# Patient Record
Sex: Female | Born: 1970 | Race: White | Hispanic: No | Marital: Married | Smoking: Never smoker
Health system: Southern US, Community
[De-identification: ages and names within clinical notes are randomized; demographics above are authoritative.]

## PROBLEM LIST (undated history)

## (undated) DIAGNOSIS — E1165 Type 2 diabetes mellitus with hyperglycemia: Secondary | ICD-10-CM

## (undated) DIAGNOSIS — K219 Gastro-esophageal reflux disease without esophagitis: Secondary | ICD-10-CM

## (undated) DIAGNOSIS — E119 Type 2 diabetes mellitus without complications: Secondary | ICD-10-CM

## (undated) DIAGNOSIS — R7303 Prediabetes: Secondary | ICD-10-CM

## (undated) DIAGNOSIS — F419 Anxiety disorder, unspecified: Secondary | ICD-10-CM

## (undated) DIAGNOSIS — Z87442 Personal history of urinary calculi: Secondary | ICD-10-CM

## (undated) DIAGNOSIS — L508 Other urticaria: Secondary | ICD-10-CM

## (undated) HISTORY — PX: TUBAL LIGATION: SHX77

---

## 1997-09-05 HISTORY — PX: TUBAL LIGATION: SHX77

## 2006-12-23 ENCOUNTER — Emergency Department: Payer: Self-pay | Admitting: Emergency Medicine

## 2006-12-24 ENCOUNTER — Emergency Department: Payer: Self-pay | Admitting: Emergency Medicine

## 2009-09-05 HISTORY — PX: LITHOTRIPSY: SUR834

## 2015-03-22 ENCOUNTER — Encounter: Payer: Self-pay | Admitting: Emergency Medicine

## 2015-03-22 DIAGNOSIS — Y9389 Activity, other specified: Secondary | ICD-10-CM | POA: Diagnosis not present

## 2015-03-22 DIAGNOSIS — Y9289 Other specified places as the place of occurrence of the external cause: Secondary | ICD-10-CM | POA: Insufficient documentation

## 2015-03-22 DIAGNOSIS — S0591XA Unspecified injury of right eye and orbit, initial encounter: Secondary | ICD-10-CM | POA: Diagnosis present

## 2015-03-22 DIAGNOSIS — Y998 Other external cause status: Secondary | ICD-10-CM | POA: Insufficient documentation

## 2015-03-22 DIAGNOSIS — W228XXA Striking against or struck by other objects, initial encounter: Secondary | ICD-10-CM | POA: Diagnosis not present

## 2015-03-22 DIAGNOSIS — S0501XA Injury of conjunctiva and corneal abrasion without foreign body, right eye, initial encounter: Secondary | ICD-10-CM | POA: Insufficient documentation

## 2015-03-22 NOTE — ED Notes (Signed)
Pt says she bent over a plant and a stem scratched her right eye; eye watering; unable to open eye due to pain;

## 2015-03-23 ENCOUNTER — Emergency Department
Admission: EM | Admit: 2015-03-23 | Discharge: 2015-03-23 | Disposition: A | Discharge status: Home / Self Care | Payer: 59 | Attending: Student | Admitting: Student

## 2015-03-23 DIAGNOSIS — S0501XA Injury of conjunctiva and corneal abrasion without foreign body, right eye, initial encounter: Secondary | ICD-10-CM

## 2015-03-23 MED ORDER — TETRACAINE HCL 0.5 % OP SOLN
OPHTHALMIC | Status: AC
  Filled 2015-03-23: qty 2

## 2015-03-23 MED ORDER — ERYTHROMYCIN 5 MG/GM OP OINT
1.0000 | TOPICAL_OINTMENT | Freq: Four times a day (QID) | OPHTHALMIC | Status: AC
Start: 2015-03-23 — End: 2015-03-29

## 2015-03-23 MED ORDER — IBUPROFEN 600 MG PO TABS: 600.0000 mg | ORAL_TABLET | Freq: Four times a day (QID) | ORAL | Status: DC | PRN

## 2015-03-23 MED ORDER — ERYTHROMYCIN 5 MG/GM OP OINT
TOPICAL_OINTMENT | Freq: Once | OPHTHALMIC | Status: AC
  Administered 2015-03-23: 1 via OPHTHALMIC
  Filled 2015-03-23: qty 1

## 2015-03-23 MED ORDER — FLUORESCEIN SODIUM 1 MG OP STRP
ORAL_STRIP | OPHTHALMIC | Status: AC
  Filled 2015-03-23: qty 1

## 2015-03-23 NOTE — ED Notes (Signed)
Pt alert and in NAD at time of d/c 

## 2015-03-23 NOTE — ED Notes (Signed)
MD at bedside. 

## 2015-03-23 NOTE — ED Provider Notes (Signed)
[facility] Emergency Department Provider Note  ____________________________________________  Time seen: Approximately 1:13 AM  I have reviewed the triage vital signs and the nursing notes.   HISTORY  Chief Complaint Eye Pain    HPI Heidi Allen is a 44 y.o. female with no chronic medical problems who presents for evaluation of traumatic right eye pain which began suddenly just prior to arrival and has been constant. Patient was bending over a plant when the stem scratched her right eye. She denies any vision changes. Her eye has been watering since this time. She does not wear contact lenses. Current severity of symptoms is 10 out of 10. No modifying factors. She has otherwise been in her usual state of health.   History reviewed. No pertinent past medical history.  There are no active problems to display for this patient.   Past Surgical History  Procedure Laterality Date  . Tubal ligation      Current Outpatient Rx  Name  Route  Sig  Dispense  Refill  . erythromycin ophthalmic ointment   Right Eye   Place 1 application into the right eye 4 (four) times daily. Dispense QS for 7 days.   3.5 g   0   . ibuprofen (ADVIL,MOTRIN) 600 MG tablet   Oral   Take 1 tablet (600 mg total) by mouth every 6 (six) hours as needed for moderate pain.   15 tablet   0     Allergies Review of patient's allergies indicates no known allergies.  History reviewed. No pertinent family history.  Social History History  Substance Use Topics  . Smoking status: Never Smoker   . Smokeless tobacco: Never Used  . Alcohol Use: No    Review of Systems Constitutional: No fever/chills Eyes: No visual changes. ENT: No sore throat. Cardiovascular: Denies chest pain. Respiratory: Denies shortness of breath. Gastrointestinal: No abdominal pain.  No nausea, no vomiting.  No diarrhea.  No constipation. Genitourinary: Negative for dysuria. Musculoskeletal: Negative for  back pain. Skin: Negative for rash. Neurological: Negative for headaches, focal weakness or numbness.  10-point ROS otherwise negative.  ____________________________________________   PHYSICAL EXAM:  VITAL SIGNS: ED Triage Vitals  Enc Vitals Group     BP 03/22/15 2333 146/78 mmHg     Pulse Rate 03/22/15 2333 91     Resp 03/22/15 2333 18     Temp 03/22/15 2333 98.1 F (36.7 C)     Temp Source 03/22/15 2333 Oral     SpO2 03/22/15 2333 98 %     Weight 03/22/15 2333 208 lb (94.348 kg)     Height 03/22/15 2333 5\' 7"  (1.702 m)     Head Cir --      Peak Flow --      Pain Score 03/22/15 2334 0     Pain Loc --      Pain Edu? --      Excl. in [place]? --     Constitutional: Alert and oriented. Well appearing and in no acute distress. Eyes: Pupils equally round and reactive to light, extra ocular movements intact. Right eye exam and with Wood's lamp under fluoroscopy seen and 2 distinct corneal abrasions are noted at the 9:00 and 11:00 position. Head: Atraumatic. Nose: No congestion/rhinnorhea. Mouth/Throat: Mucous membranes are moist.  Oropharynx non-erythematous. Neck: No stridor.   Cardiovascular: Normal rate, regular rhythm. Grossly normal heart sounds.  Good peripheral circulation. Respiratory: Normal respiratory effort.  No retractions. Lungs CTAB. Gastrointestinal: Soft and nontender. No  distention. No abdominal bruits. No CVA tenderness. Genitourinary: deferred Musculoskeletal: No lower extremity tenderness nor edema.  No joint effusions. Neurologic:  Normal speech and language. No gross focal neurologic deficits are appreciated. No gait instability. Skin:  Skin is warm, dry and intact. No rash noted. Psychiatric: Mood and affect are normal. Speech and behavior are normal.  ____________________________________________   LABS (all labs ordered are listed, but only abnormal results are displayed)  Labs Reviewed - No data to  display ____________________________________________  EKG  none ____________________________________________  RADIOLOGY none ____________________________________________   PROCEDURES  Procedure(s) performed: None  Critical Care performed: No  ____________________________________________   INITIAL IMPRESSION / ASSESSMENT AND PLAN / ED COURSE  Pertinent labs & imaging results that were available during my care of the patient were reviewed by me and considered in my medical decision making (see chart for details).  Heidi Allen is a 44 y.o. female with no chronic medical problems who presents for evaluation of traumatic right eye pain which began suddenly just prior to arrival and has been constant. On exam, she is unable to open the right eye secondary to pain. She is. Immediate relief after tetracaine eyedrops and was able to open the eye, pupils equally reactive to light, extraocular movements intact. Exam is consistent with corneal abrasions of the right eye. She does not wear contact lenses. She has no vision compromise (VA 20/25 in right eye, 20/30 left eye). We'll discharge with erythromycin drops and she will follow-up with her eye doctor within the next 2-3 days. Return precautions discussed. DC home. She reports she does not want any narcotic pain medications so we'll discharge with ibuprofen. ____________________________________________   FINAL CLINICAL IMPRESSION(S) / ED DIAGNOSES  Final diagnoses:  Corneal abrasion, right, initial encounter      Heidi Gavel, MD 03/23/15 0236

## 2015-03-23 NOTE — ED Notes (Addendum)
Pt stated she did not want to wait anymore and was leaving. RN encouraged pt to stay. Pt walked back into room.

## 2017-12-08 ENCOUNTER — Encounter: Payer: Self-pay | Admitting: Emergency Medicine

## 2017-12-08 ENCOUNTER — Other Ambulatory Visit: Payer: Self-pay

## 2017-12-08 ENCOUNTER — Ambulatory Visit
Admission: EM | Admit: 2017-12-08 | Discharge: 2017-12-08 | Disposition: A | Discharge status: Home / Self Care | Payer: BLUE CROSS/BLUE SHIELD | Attending: Family Medicine | Admitting: Family Medicine

## 2017-12-08 DIAGNOSIS — J0110 Acute frontal sinusitis, unspecified: Secondary | ICD-10-CM | POA: Diagnosis not present

## 2017-12-08 MED ORDER — HYDROCOD POLST-CPM POLST ER 10-8 MG/5ML PO SUER: 5.0000 mL | Freq: Every evening | ORAL | 0 refills | Status: DC | PRN

## 2017-12-08 MED ORDER — AMOXICILLIN-POT CLAVULANATE 875-125 MG PO TABS: 1.0000 | ORAL_TABLET | Freq: Two times a day (BID) | ORAL | 0 refills | Status: DC

## 2017-12-08 NOTE — ED Provider Notes (Signed)
[facility]  CSN: [id number] Arrival date & time: 12/08/17  5284   History   Chief Complaint Chief Complaint  Patient presents with  . Cough  . Sinus Problem  . Nasal Congestion   HPI  47 year old female presents with the above complaints.  Patient reports a 2-week history of sinus pressure and pain.  Associated cough.  No fever.  She reports discolored nasal discharge.  She reports productive cough.  She been taking Mucinex without improvement.  No known exacerbating factors.  No other associated symptoms.  No other complaints or concerns at this time.  PMH: Urinary tract infection    Anemia  Tx w/ Fe supplementation  Thyroid disease    Thyroid disease    Cholelithiasis 05/26/2012   GERD (gastroesophageal reflux disease) 07/08/2011   Alopecia areata 04/08/2013   Anxiety 05/18/2016 Overview: metoprolol  Hyperglycemia 06/07/2014 Overview: a1c 6.0 on 06/2014  Hypertriglyceridemia 06/07/2014   Iron deficiency anemia due to chronic blood loss 06/07/2014   Obesity 04/15/2013   Seasonal allergies    Diabetes mellitus (CMS-HCC)    Folliculitis     Past Surgical History:  Procedure Laterality Date  . TUBAL LIGATION     OB History   None      Home Medications    Prior to Admission medications   Medication Sig Start Date End Date Taking? Authorizing Provider  ferrous sulfate 325 (65 FE) MG tablet Take by mouth.   Yes [provider]  metFORMIN (GLUCOPHAGE) 500 MG tablet Take by mouth 2 (two) times daily with a meal.   Yes [provider]  metFORMIN (GLUCOPHAGE-XR) 500 MG 24 hr tablet  12/06/17  Yes [provider]  amoxicillin-clavulanate (AUGMENTIN) 875-125 MG tablet Take 1 tablet by mouth every 12 (twelve) hours. 12/08/17   Coral Spikes, DO  chlorpheniramine-HYDROcodone (TUSSIONEX PENNKINETIC ER) 10-8 MG/5ML SUER Take 5 mLs by mouth at bedtime as needed. 12/08/17   Coral Spikes, DO  ibuprofen (ADVIL,MOTRIN) 600 MG tablet Take 1  tablet (600 mg total) by mouth every 6 (six) hours as needed for moderate pain. 03/23/15   Joanne Gavel, MD  Multiple Vitamin (MULTI-VITAMINS) TABS Take by mouth.    [provider]   Family History Family History  Problem Relation Age of Onset  . Diabetes Mother   . Diabetes Father    Social History Social History   Tobacco Use  . Smoking status: Never Smoker  . Smokeless tobacco: Never Used  Substance Use Topics  . Alcohol use: No  . Drug use: No     Allergies   Patient has no known allergies.   Review of Systems Review of Systems  Constitutional: Negative for fever.  HENT: Positive for sinus pressure and sinus pain.   Respiratory: Positive for cough.    Physical Exam Triage Vital Signs ED Triage Vitals  Enc Vitals Group     BP 12/08/17 0832 124/69     Pulse Rate 12/08/17 0832 (!) 102     Resp 12/08/17 0832 14     Temp 12/08/17 0832 98.9 F (37.2 C)     Temp Source 12/08/17 0832 Oral     SpO2 12/08/17 0832 99 %     Weight 12/08/17 0828 195 lb (88.5 kg)     Height 12/08/17 0828 5\' 7"  (1.702 m)     Head Circumference --      Peak Flow --      Pain Score 12/08/17 0828 2  Pain Loc --      Pain Edu? --      Excl. in [place]? --    Updated Vital Signs BP 124/69 (BP Location: Left Arm)   Pulse (!) 102   Temp 98.9 F (37.2 C) (Oral)   Resp 14   Ht 5\' 7"  (1.702 m)   Wt 195 lb (88.5 kg)   LMP 12/04/2017 (Exact Date)   SpO2 99%   BMI 30.54 kg/m   Physical Exam  Constitutional: She is oriented to person, place, and time. She appears well-developed. No distress.  HENT:  Head: Normocephalic and atraumatic.  Mouth/Throat: Oropharynx is clear and moist.  Frontal sinus tenderness to palpation.  Eyes: Conjunctivae are normal. Right eye exhibits no discharge. Left eye exhibits no discharge.  Cardiovascular: Normal rate and regular rhythm.  Pulmonary/Chest: Effort normal and breath sounds normal. She has no wheezes. She has no rales.  Neurological: She  is alert and oriented to person, place, and time.  Psychiatric: She has a normal mood and affect. Her behavior is normal.  Nursing note and vitals reviewed.  UC Treatments / Results  Labs (all labs ordered are listed, but only abnormal results are displayed) Labs Reviewed - No data to display  EKG None Radiology No results found.  Procedures Procedures (including critical care time)  Medications Ordered in UC Medications - No data to display   Initial Impression / Assessment and Plan / UC Course  I have reviewed the triage vital signs and the nursing notes.  Pertinent labs & imaging results that were available during my care of the patient were reviewed by me and considered in my medical decision making (see chart for details).     47 year old female presents with sinusitis.  Treating with Augmentin.  Tussionex for cough.  Final Clinical Impressions(s) / UC Diagnoses   Final diagnoses:  Acute frontal sinusitis, recurrence not specified    ED Discharge Orders        Ordered    amoxicillin-clavulanate (AUGMENTIN) 875-125 MG tablet  Every 12 hours     12/08/17 0839    chlorpheniramine-HYDROcodone (TUSSIONEX PENNKINETIC ER) 10-8 MG/5ML SUER  At bedtime PRN     12/08/17 0839     Controlled Substance Prescriptions [place] Controlled Substance Registry consulted? Not Applicable   Coral Spikes, [place] 12/08/17 [phone]

## 2017-12-08 NOTE — ED Triage Notes (Signed)
Patient c/o sinus congestion and pressure, cough, and scratchy throat for the past 2 weeks.  Patient denies fevers.

## 2020-05-25 ENCOUNTER — Encounter: Payer: Self-pay | Admitting: Obstetrics and Gynecology

## 2020-05-25 ENCOUNTER — Ambulatory Visit (INDEPENDENT_AMBULATORY_CARE_PROVIDER_SITE_OTHER): Payer: 59 | Admitting: Obstetrics and Gynecology

## 2020-05-25 ENCOUNTER — Other Ambulatory Visit: Payer: Self-pay

## 2020-05-25 VITALS — BP 146/90 | HR 124 | Ht 67.0 in | Wt 202.0 lb

## 2020-05-25 DIAGNOSIS — N939 Abnormal uterine and vaginal bleeding, unspecified: Secondary | ICD-10-CM | POA: Diagnosis not present

## 2020-05-25 MED ORDER — MEDROXYPROGESTERONE ACETATE 10 MG PO TABS: 20.0000 mg | ORAL_TABLET | Freq: Every day | ORAL | 2 refills | Status: DC

## 2020-05-25 NOTE — Progress Notes (Signed)
Gynecology Abnormal Uterine Bleeding Initial Evaluation   Chief Complaint:  Chief Complaint  Patient presents with  . Menorrhagia    History of Present Illness:    Paitient is a 49 y.o. G3P3 who LMP was Patient's last menstrual period was 05/19/2020., presents today for a problem visit.  She complains of menorrhagia that  began several years ago and its severity is described as moderate.  The patient menstrual complaints are chronic present for the past 6 months.  Menstrual cycles are associated with moderate menstrual cramping.  She does report passage of clots. The patient is sexually active. She currently uses tubal ligationfor contraception.  Last Pap results tesults were obtained 06/13/2019 NIL and HR HPV negative   Previous evaluation: CT abdomen and pelvis 2008 normal reproductive structures. TVUS 04/12/2018 with uterine enlargement noted Posterior uterine fundus intramural fibroid measures 3.5 x 2.5 x 3.7 cm. Posterior mid uterine body subserosal fibroid measures 1.7 x 1.1 x 1.7 cm. Posterior uterine body intramural fibroid measures 1.5 x 1.5 x 1.3 cm   Paramter Normal / Abnormal Prsent  Frequency [place]     Infrequent (>38 days)     Normal (?24 days ?38 days) X   Freequent (<24 days)    Duration Normal (?8 days)     Prolonged (>8 days) X  Regularity Regular (shortest to longest cycle variation ?7-9 days)*     Irregular (shortest to longest cycle variation ?8-10days)* X  Flow Volume Light    (Self reported) Normal     Heavy X      Intermenstrual Bleeding None X   Random     Cyclical early     Cyclical mid     Cyclical late        Unscheduled Bleeding  Not applicable X  (exogenous hormones) Absent     Present     FIGO AUB I System: *The available evidence suggests that, using these criteria, the normal range (shortest to longest) varies with age: 82-25 y of age, ?2 d; 17-41 y, ?7 d; and for 20-45 y, ?9 d    Review of Systems: Review of Systems   Constitutional: Negative.   Gastrointestinal: Negative.   Genitourinary: Negative.   Endo/Heme/Allergies: Does not bruise/bleed easily.    Past Medical History:  There are no problems to display for this patient.   Past Surgical History:  Past Surgical History:  Procedure Laterality Date  . TUBAL LIGATION      Obstetric History: G3P3  Family History:  Family History  Problem Relation Age of Onset  . Diabetes Mother   . Diabetes Father     Social History:  Social History   Socioeconomic History  . Marital status: Married    Spouse name: Not on file  . Number of children: Not on file  . Years of education: Not on file  . Highest education level: Not on file  Occupational History  . Not on file  Tobacco Use  . Smoking status: Never Smoker  . Smokeless tobacco: Never Used  Substance and Sexual Activity  . Alcohol use: No  . Drug use: No  . Sexual activity: Yes    Birth control/protection: Pill  Other Topics Concern  . Not on file  Social History Narrative  . Not on file   Social Determinants of Health   Financial Resource Strain:   . Difficulty of Paying Living Expenses: Not on file  Food Insecurity:   . Worried About Charity fundraiser in  the Last Year: Not on file  . Ran Out of Food in the Last Year: Not on file  Transportation Needs:   . Lack of Transportation (Medical): Not on file  . Lack of Transportation (Non-Medical): Not on file  Physical Activity:   . Days of Exercise per Week: Not on file  . Minutes of Exercise per Session: Not on file  Stress:   . Feeling of Stress : Not on file  Social Connections:   . Frequency of Communication with Friends and Family: Not on file  . Frequency of Social Gatherings with Friends and Family: Not on file  . Attends Religious Services: Not on file  . Active Member of Clubs or Organizations: Not on file  . Attends Archivist Meetings: Not on file  . Marital Status: Not on file  Intimate Partner  Violence:   . Fear of Current or Ex-Partner: Not on file  . Emotionally Abused: Not on file  . Physically Abused: Not on file  . Sexually Abused: Not on file    Allergies:  No Known Allergies  Medications: Prior to Admission medications   Medication Sig Start Date End Date Taking? Authorizing Provider  ferrous sulfate 325 (65 FE) MG tablet Take by mouth.   Yes [provider]  ibuprofen (ADVIL,MOTRIN) 600 MG tablet Take 1 tablet (600 mg total) by mouth every 6 (six) hours as needed for moderate pain. 03/23/15  Yes Loura Pardon A, MD  ipratropium (ATROVENT) 0.06 % nasal spray Place into both nostrils. 04/24/20  Yes [provider]  Multiple Vitamin (MULTI-VITAMINS) TABS Take by mouth.   Yes [provider]  TRI-ESTARYLLA 0.18/0.215/0.25 MG-35 MCG tablet Take 1 tablet by mouth daily. 05/21/20  Yes [provider]    Physical Exam Blood pressure (!) 146/90, pulse (!) 124, height 5\' 7"  (1.702 m), weight 202 lb (91.6 kg), last menstrual period 05/19/2020.  Patient's last menstrual period was 05/19/2020.  General: NAD HEENT: normocephalic, anicteric Pulmonary: No increased work of breathing Abdomen: soft, non-tender, non-distended.  Umbilicus without lesions.  No hepatomegaly, splenomegaly or masses palpable. No evidence of hernia  Genitourinary:  External: Normal external female genitalia.  Normal urethral meatus, normal Bartholin's and Skene's glands.    Vagina: Normal vaginal mucosa, no evidence of prolapse.    Cervix: Grossly normal in appearance, minimal bleeding  Uterus: Enlarged, mobile, irregular contour.  At least one left fundal fibroid palpated and one right lower uterine segment fibroid. No CMT  Adnexa: ovaries non-enlarged, no adnexal masses  Rectal: deferred  Lymphatic: no evidence of inguinal lymphadenopathy Extremities: no edema, erythema, or tenderness Neurologic: Grossly intact Psychiatric: mood appropriate, affect full  Female  chaperone present for pelvic portions of the physical exam  Assessment: 49 y.o. G3P3 with abnormal uterine bleeding  Plan: Problem List Items Addressed This Visit    None    Visit Diagnoses    Abnormal uterine bleeding    -  Primary   Relevant Orders   CBC   Thyroid Panel With TSH   Prolactin   FSH   Estradiol   US Transvaginal Non-OB      1) Discussed management options for abnormal uterine bleeding including expectant, NSAIDs, tranexamic acid (Lysteda), oral progesterone (Provera, norethindrone, megace), Depo Provera, Levonorgestrel containing IUD, endometrial ablation (Novasure) or hysterectomy as definitive surgical management.  Discussed risks and benefits of each method.   Final management decision will hinge on results of patient's work up and whether an underlying etiology for the patients  bleeding symptoms can be discerned.  We will conduct a basic work up examining using the PALM-COIEN classification system.  In the meantime the patient opts to trial provera 20mg  daily while we await results of her ultrasound and labs.  The role of unopposed estrogen in the development of endometrial hyperplasia or carcinoma is discussed.  The risk of endometrial hyperplasia is linearly correlated with increasing BMI given the production of estrone by adipose tissue. Printed patient education handouts were given to the patient to review at home.  Bleeding precautions reviewed.   2) Return in about 1 week (around 06/01/2020) for 1-2 weeks TVUS and follow up.   Malachy Mood, MD, [facility], [facility] 05/25/2020, 4:07 PM

## 2020-05-26 LAB — CBC
Hematocrit: 29.1 % — ABNORMAL LOW (ref 34.0–46.6)
Hemoglobin: 10 g/dL — ABNORMAL LOW (ref 11.1–15.9)
MCH: 31.1 pg (ref 26.6–33.0)
MCHC: 34.4 g/dL (ref 31.5–35.7)
MCV: 90 fL (ref 79–97)
Platelets: 376 10*3/uL (ref 150–450)
RBC: 3.22 x10E6/uL — ABNORMAL LOW (ref 3.77–5.28)
RDW: 12.7 % (ref 11.7–15.4)
WBC: 15.8 10*3/uL — ABNORMAL HIGH (ref 3.4–10.8)

## 2020-05-26 LAB — FOLLICLE STIMULATING HORMONE: FSH: 4.9 m[IU]/mL

## 2020-05-26 LAB — PROLACTIN: Prolactin: 15.3 ng/mL (ref 4.8–23.3)

## 2020-05-26 LAB — THYROID PANEL WITH TSH
Free Thyroxine Index: 0.9 — ABNORMAL LOW (ref 1.2–4.9)
T3 Uptake Ratio: 11 % — ABNORMAL LOW (ref 24–39)
T4, Total: 8.5 ug/dL (ref 4.5–12.0)
TSH: 0.852 u[IU]/mL (ref 0.450–4.500)

## 2020-05-26 LAB — ESTRADIOL: Estradiol: <5 pg/mL

## 2020-05-28 ENCOUNTER — Other Ambulatory Visit: Payer: Self-pay | Admitting: Obstetrics and Gynecology

## 2020-05-28 MED ORDER — MEDROXYPROGESTERONE ACETATE 10 MG PO TABS: ORAL_TABLET | ORAL | 0 refills | Status: DC

## 2020-06-15 ENCOUNTER — Other Ambulatory Visit: Payer: Self-pay

## 2020-06-15 ENCOUNTER — Ambulatory Visit (INDEPENDENT_AMBULATORY_CARE_PROVIDER_SITE_OTHER): Payer: 59

## 2020-06-15 ENCOUNTER — Ambulatory Visit (INDEPENDENT_AMBULATORY_CARE_PROVIDER_SITE_OTHER): Payer: 59 | Admitting: Obstetrics and Gynecology

## 2020-06-15 ENCOUNTER — Other Ambulatory Visit: Payer: Self-pay | Admitting: Obstetrics and Gynecology

## 2020-06-15 ENCOUNTER — Encounter: Payer: Self-pay | Admitting: Obstetrics and Gynecology

## 2020-06-15 ENCOUNTER — Ambulatory Visit: Payer: 59

## 2020-06-15 VITALS — BP 140/90 | Ht 67.0 in | Wt 201.0 lb

## 2020-06-15 DIAGNOSIS — D250 Submucous leiomyoma of uterus: Secondary | ICD-10-CM

## 2020-06-15 DIAGNOSIS — D251 Intramural leiomyoma of uterus: Secondary | ICD-10-CM | POA: Diagnosis not present

## 2020-06-15 DIAGNOSIS — N939 Abnormal uterine and vaginal bleeding, unspecified: Secondary | ICD-10-CM

## 2020-06-15 DIAGNOSIS — D252 Subserosal leiomyoma of uterus: Secondary | ICD-10-CM | POA: Diagnosis not present

## 2020-06-15 NOTE — Progress Notes (Signed)
Gynecology Ultrasound Follow Up  Chief Complaint:  Chief Complaint  Patient presents with  . Follow-up     History of Present Illness: Patient is a 49 y.o. female who presents today for ultrasound evaluation of AUB .  Ultrasound demonstrates the following findgins Adnexa: no masses seen  Uterus: Enlarged with multiple uterine fibroids, with at least one having a 50% submucosal component, endometrial stripe thin without focal abnormalities Additional: no free fluid  Bleeding has subsided since starting provera, initially stopped at 20mg  tid dose now very light on 20mg  po daily dose.  Review of Systems: 10 point review of systems negative unless noted in HPI  Past Medical History:  History reviewed. No pertinent past medical history.  Past Surgical History:  Past Surgical History:  Procedure Laterality Date  . TUBAL LIGATION      Gynecologic History:  Patient's last menstrual period was 05/19/2020. Contraception: oral progesterone-only contraceptive Pap: 06/13/2019 NILM  Family History:  Family History  Problem Relation Age of Onset  . Diabetes Mother   . Diabetes Father     Social History:  Social History   Socioeconomic History  . Marital status: Married    Spouse name: Not on file  . Number of children: Not on file  . Years of education: Not on file  . Highest education level: Not on file  Occupational History  . Not on file  Tobacco Use  . Smoking status: Never Smoker  . Smokeless tobacco: Never Used  Substance and Sexual Activity  . Alcohol use: No  . Drug use: No  . Sexual activity: Yes    Birth control/protection: None  Other Topics Concern  . Not on file  Social History Narrative  . Not on file   Social Determinants of Health   Financial Resource Strain:   . Difficulty of Paying Living Expenses: Not on file  Food Insecurity:   . Worried About Charity fundraiser in the Last Year: Not on file  . Ran Out of Food in the Last Year: Not on  file  Transportation Needs:   . Lack of Transportation (Medical): Not on file  . Lack of Transportation (Non-Medical): Not on file  Physical Activity:   . Days of Exercise per Week: Not on file  . Minutes of Exercise per Session: Not on file  Stress:   . Feeling of Stress : Not on file  Social Connections:   . Frequency of Communication with Friends and Family: Not on file  . Frequency of Social Gatherings with Friends and Family: Not on file  . Attends Religious Services: Not on file  . Active Member of Clubs or Organizations: Not on file  . Attends Archivist Meetings: Not on file  . Marital Status: Not on file  Intimate Partner Violence:   . Fear of Current or Ex-Partner: Not on file  . Emotionally Abused: Not on file  . Physically Abused: Not on file  . Sexually Abused: Not on file    Allergies:  No Known Allergies  Medications: Prior to Admission medications   Medication Sig Start Date End Date Taking? Authorizing Provider  ferrous sulfate 325 (65 FE) MG tablet Take by mouth.   Yes [provider]  ibuprofen (ADVIL,MOTRIN) 600 MG tablet Take 1 tablet (600 mg total) by mouth every 6 (six) hours as needed for moderate pain. 03/23/15  Yes Loura Pardon A, MD  ipratropium (ATROVENT) 0.06 % nasal spray Place into both nostrils. 04/24/20  Yes  [provider]  medroxyPROGESTERone (PROVERA) 10 MG tablet Take 2 tablets (20mg ) po tid x 7 days, then 2 tablets (20mg ) po once daily maintenance after the initial 7 days 05/28/20  Yes Malachy Mood, MD  Multiple Vitamin (MULTI-VITAMINS) TABS Take by mouth.   Yes [provider]    Physical Exam Vitals: Blood pressure 140/90, height 5\' 7"  (1.702 m), weight 201 lb (91.2 kg), last menstrual period 05/19/2020.  General: NAD HEENT: normocephalic, anicteric Pulmonary: No increased work of breathing Extremities: no edema, erythema, or tenderness Neurologic: Grossly intact, normal gait Psychiatric: mood  appropriate, affect full   Assessment: 49 y.o. G3P3 with AUB-L  Plan: Problem List Items Addressed This Visit    None    Visit Diagnoses    Abnormal uterine bleeding    -  Primary   Intramural, submucous, and subserous leiomyoma of uterus          1) AUB-L - multiple small fibroids with at least 1 having a submucosal component.  Partial response to provera.  Discussed continued provera, Mirena IUD, hysteroscopy with resection of submucosal myoma and IUD placement, vs hysterectomy.  Based on the number and relatively small size of each individual myoma not a candidate for [place].  While each fibroid in and of itself is relatively small the total volume of fibroid means that almost 50% of the uterus is comprised of fibroids. - patient opts for hysteroscopic resection of submucosal fibroid with concurrent Mirena IUD palcement  2) A total of 15 minutes were spent in face-to-face contact with the patient during this encounter with over half of that time devoted to counseling and coordination of care.  3) Return in about 1 week (around 06/22/2020), or if symptoms worsen or fail to improve.    Malachy Mood, MD, [facility], [facility] 06/15/2020, 4:47 PM

## 2020-06-18 ENCOUNTER — Telehealth: Payer: Self-pay | Admitting: Obstetrics and Gynecology

## 2020-06-18 NOTE — Telephone Encounter (Signed)
Patient called to schedule Hysteroscopy D&C w Georgianne Fick  DOS 11/18  H&P 11/11 @ 4:30, [place]   Covid testing 11/16 @ 8-10:30, [address], drive up and wear mask. Advised pt to quarantine until DOS.  Pre-admit phone call appointment to be requested - date and time will be included on H&P paper work. Also all appointments will be updated on pt MyChart. Explained that this appointment has a call window. Based on the time scheduled will indicate if the call will be received within a 4 hour window before 1:00 or after.  Advised that pt may also receive calls from the hospital pharmacy and pre-service center.  Confirmed pt has [employer] as Chartered certified accountant. No secondary insurance.

## 2020-06-18 NOTE — Telephone Encounter (Signed)
-----   Message from Malachy Mood, MD sent at 06/15/2020  4:48 PM EDT ----- Regarding: Surgery Surgery Booking Request Patient Full Name:  Heidi Allen  MRN: [id number]  DOB: 23-May-1971  Surgeon: Malachy Mood, MD  Requested Surgery Date and Time: 2-4 weeks Primary Diagnosis AND Code: AUB-L N93.9, D25.0, D25.1, D25.2 Secondary Diagnosis and Code:  Surgical Procedure: Hysteroscopy, D&C RNFA Requested?: No L&D Notification: No Admission Status: same day surgery Length of Surgery: 50 min Special Case Needs: No H&P: Yes Phone Interview???:  Yes Interpreter: No Medical Clearance:  No Special Scheduling Instructions: No Any known health/anesthesia issues, diabetes, sleep apnea, latex allergy, defibrillator/pacemaker?: No Acuity: P3   (P1 highest, P2 delay may cause harm, P3 low, elective gyn, P4 lowest)

## 2020-07-09 ENCOUNTER — Other Ambulatory Visit: Payer: Self-pay | Admitting: Obstetrics and Gynecology

## 2020-07-16 ENCOUNTER — Ambulatory Visit (INDEPENDENT_AMBULATORY_CARE_PROVIDER_SITE_OTHER): Payer: 59 | Admitting: Obstetrics and Gynecology

## 2020-07-16 ENCOUNTER — Other Ambulatory Visit: Payer: Self-pay

## 2020-07-16 ENCOUNTER — Encounter: Payer: Self-pay | Admitting: Obstetrics and Gynecology

## 2020-07-16 VITALS — BP 131/89 | Ht 67.0 in | Wt 205.0 lb

## 2020-07-16 DIAGNOSIS — Z01818 Encounter for other preprocedural examination: Secondary | ICD-10-CM

## 2020-07-16 DIAGNOSIS — D250 Submucous leiomyoma of uterus: Secondary | ICD-10-CM | POA: Diagnosis not present

## 2020-07-16 DIAGNOSIS — D251 Intramural leiomyoma of uterus: Secondary | ICD-10-CM | POA: Diagnosis not present

## 2020-07-16 DIAGNOSIS — N939 Abnormal uterine and vaginal bleeding, unspecified: Secondary | ICD-10-CM

## 2020-07-16 NOTE — H&P (View-Only) (Signed)
Obstetrics & Gynecology Surgery H&P    Chief Complaint: Scheduled Surgery   History of Present Illness: Patient is a 49 y.o. G3P3 presenting for scheduled hysteroscopy, D&C, myomectomy, and IUD placement, for the treatment or further evaluation of AUB-L.   Prior Treatments prior to proceeding with surgery include: po progestin therapy  Preoperative Pap: 06/13/2019 NILM HPV neg Preoperative Endometrial biopsy: N/A focal lesion Preoperative Ultrasound: 06/15/2020 at least 7 uterine fibroids, at least one of which has a 50% submucosal component measuring 18.9 x 20.9 x 22.52mm.   Review of Systems:10 point review of systems  Past Medical History:  There are no problems to display for this patient.   Past Surgical History:  Past Surgical History:  Procedure Laterality Date  . TUBAL LIGATION      Family History:  Family History  Problem Relation Age of Onset  . Diabetes Mother   . Diabetes Father     Social History:  Social History   Socioeconomic History  . Marital status: Married    Spouse name: Not on file  . Number of children: Not on file  . Years of education: Not on file  . Highest education level: Not on file  Occupational History  . Not on file  Tobacco Use  . Smoking status: Never Smoker  . Smokeless tobacco: Never Used  Substance and Sexual Activity  . Alcohol use: No  . Drug use: No  . Sexual activity: Yes    Birth control/protection: None  Other Topics Concern  . Not on file  Social History Narrative  . Not on file   Social Determinants of Health   Financial Resource Strain:   . Difficulty of Paying Living Expenses: Not on file  Food Insecurity:   . Worried About Charity fundraiser in the Last Year: Not on file  . Ran Out of Food in the Last Year: Not on file  Transportation Needs:   . Lack of Transportation (Medical): Not on file  . Lack of Transportation (Non-Medical): Not on file  Physical Activity:   . Days of Exercise per Week: Not  on file  . Minutes of Exercise per Session: Not on file  Stress:   . Feeling of Stress : Not on file  Social Connections:   . Frequency of Communication with Friends and Family: Not on file  . Frequency of Social Gatherings with Friends and Family: Not on file  . Attends Religious Services: Not on file  . Active Member of Clubs or Organizations: Not on file  . Attends Archivist Meetings: Not on file  . Marital Status: Not on file  Intimate Partner Violence:   . Fear of Current or Ex-Partner: Not on file  . Emotionally Abused: Not on file  . Physically Abused: Not on file  . Sexually Abused: Not on file    Allergies:  No Known Allergies  Medications: Prior to Admission medications   Medication Sig Start Date End Date Taking? Authorizing Provider  ferrous sulfate 325 (65 FE) MG tablet Take by mouth.    Yes [provider]  ibuprofen (ADVIL,MOTRIN) 600 MG tablet Take 1 tablet (600 mg total) by mouth every 6 (six) hours as needed for moderate pain. 03/23/15  Yes Loura Pardon A, MD  ipratropium (ATROVENT) 0.06 % nasal spray Place into both nostrils.  04/24/20  Yes [provider]  medroxyPROGESTERone (PROVERA) 10 MG tablet TAKE 2 TABLETS 3 TIMES DAILY X 7 DAYS, THEN 2 TABLETS ONCE DAILY MAINTENANCE  Patient taking differently: Take 20 mg by mouth daily.  07/13/20  Yes Malachy Mood, MD  Multiple Vitamin (MULTI-VITAMINS) TABS Take by mouth.    Yes [provider]    Physical Exam Vitals: Blood pressure 131/89, height 5\' 7"  (1.702 m), weight 205 lb (93 kg).  General: NAD, well nourished, appears stated age 76: normocephalic, anicteric Pulmonary: No increased work of breathing, CTAB Cardiovascular: RRR, distal pulses 2+ Genitourinary: deferred Extremities: no edema, erythema, or tenderness Neurologic: Grossly intact Psychiatric: mood appropriate, affect full  Imaging No results found.  Assessment: 49 y.o. G3P3 presenting for scheduled  hysteroscopy, D&C, myomectomy, and IUD placement  Plan: 1) I have discussed with the patient the indications for the procedure. Included in the discussion were the options of therapy, as wall as their individual risks, benefits, and complications. Ample time was given to answer all questions.   In office pipelle biopsy generally provides comparable results to [facility], however this sampling modality may miss focal lesions if these were previously documented on ultrasound.  It is because of the potential to miss focal lesions that hysteroscopy D&C is also warranted in patient with continued postmenopausal bleeding that is not self limited regardless of prior in office biopsy results or ultrasound findings.  She understands that the risk of continued observation include worsening bleeding or worsening of any underlying pathology.  The choices include: 1. Doing nothing but following her symptoms 2. Attempts at hormonal manipulation with either BCP or Depo-Provera for premenopausal patients with no concern for focal lesion or endometrial pathology 3. D&C/hysteroscopy. 4. Endometrial ablation via Novasure or other techniques for premenopausal patients with no concern for focal lesion or endometrial pathology  5. As final resort, hysterectomy. After consideration of her history and findings, mutual decision has been made to proceed with D+C/hysteroscopy. While the incidence is low, the risks from this surgery include, but are not limited to, the risks of anesthesia, hemorrhage, infection, perforation, and injury to adjacent structures including bowel, bladder and blood vessels.  - patient has responded well to oral progestin therapy - possible Mirena IUD placement depending on the degree of resection of the submucosal leiomyoma   2) Routine postoperative instructions were reviewed with the patient and her family in detail today including the expected length of recovery and likely postoperative course.  The patient  concurred with the proposed plan, giving informed written consent for the surgery today.  Patient instructed on the importance of being NPO after midnight prior to her procedure.  If warranted preoperative prophylactic antibiotics and SCDs ordered on call to the OR to meet SCIP guidelines and adhere to recommendation laid forth in [place] Number 104 May 2009  "Antibiotic Prophylaxis for Gynecologic Procedures".     Malachy Mood, MD, [facility], [facility] 07/16/2020, 4:58 PM

## 2020-07-16 NOTE — Progress Notes (Signed)
Obstetrics & Gynecology Surgery H&P    Chief Complaint: Scheduled Surgery   History of Present Illness: Patient is a 49 y.o. G3P3 presenting for scheduled hysteroscopy, D&C, myomectomy, and IUD placement, for the treatment or further evaluation of AUB-L.   Prior Treatments prior to proceeding with surgery include: po progestin therapy  Preoperative Pap: 06/13/2019 NILM HPV neg Preoperative Endometrial biopsy: N/A focal lesion Preoperative Ultrasound: 06/15/2020 at least 7 uterine fibroids, at least one of which has a 50% submucosal component measuring 18.9 x 20.9 x 22.34mm.   Review of Systems:10 point review of systems  Past Medical History:  There are no problems to display for this patient.   Past Surgical History:  Past Surgical History:  Procedure Laterality Date  . TUBAL LIGATION      Family History:  Family History  Problem Relation Age of Onset  . Diabetes Mother   . Diabetes Father     Social History:  Social History   Socioeconomic History  . Marital status: Married    Spouse name: Not on file  . Number of children: Not on file  . Years of education: Not on file  . Highest education level: Not on file  Occupational History  . Not on file  Tobacco Use  . Smoking status: Never Smoker  . Smokeless tobacco: Never Used  Substance and Sexual Activity  . Alcohol use: No  . Drug use: No  . Sexual activity: Yes    Birth control/protection: None  Other Topics Concern  . Not on file  Social History Narrative  . Not on file   Social Determinants of Health   Financial Resource Strain:   . Difficulty of Paying Living Expenses: Not on file  Food Insecurity:   . Worried About Charity fundraiser in the Last Year: Not on file  . Ran Out of Food in the Last Year: Not on file  Transportation Needs:   . Lack of Transportation (Medical): Not on file  . Lack of Transportation (Non-Medical): Not on file  Physical Activity:   . Days of Exercise per Week: Not  on file  . Minutes of Exercise per Session: Not on file  Stress:   . Feeling of Stress : Not on file  Social Connections:   . Frequency of Communication with Friends and Family: Not on file  . Frequency of Social Gatherings with Friends and Family: Not on file  . Attends Religious Services: Not on file  . Active Member of Clubs or Organizations: Not on file  . Attends Archivist Meetings: Not on file  . Marital Status: Not on file  Intimate Partner Violence:   . Fear of Current or Ex-Partner: Not on file  . Emotionally Abused: Not on file  . Physically Abused: Not on file  . Sexually Abused: Not on file    Allergies:  No Known Allergies  Medications: Prior to Admission medications   Medication Sig Start Date End Date Taking? Authorizing Provider  ferrous sulfate 325 (65 FE) MG tablet Take by mouth.    Yes [provider]  ibuprofen (ADVIL,MOTRIN) 600 MG tablet Take 1 tablet (600 mg total) by mouth every 6 (six) hours as needed for moderate pain. 03/23/15  Yes Loura Pardon A, MD  ipratropium (ATROVENT) 0.06 % nasal spray Place into both nostrils.  04/24/20  Yes [provider]  medroxyPROGESTERone (PROVERA) 10 MG tablet TAKE 2 TABLETS 3 TIMES DAILY X 7 DAYS, THEN 2 TABLETS ONCE DAILY MAINTENANCE  Patient taking differently: Take 20 mg by mouth daily.  07/13/20  Yes Malachy Mood, MD  Multiple Vitamin (MULTI-VITAMINS) TABS Take by mouth.    Yes [provider]    Physical Exam Vitals: Blood pressure 131/89, height 5\' 7"  (1.702 m), weight 205 lb (93 kg).  General: NAD, well nourished, appears stated age 2: normocephalic, anicteric Pulmonary: No increased work of breathing, CTAB Cardiovascular: RRR, distal pulses 2+ Genitourinary: deferred Extremities: no edema, erythema, or tenderness Neurologic: Grossly intact Psychiatric: mood appropriate, affect full  Imaging No results found.  Assessment: 49 y.o. G3P3 presenting for scheduled  hysteroscopy, D&C, myomectomy, and IUD placement  Plan: 1) I have discussed with the patient the indications for the procedure. Included in the discussion were the options of therapy, as wall as their individual risks, benefits, and complications. Ample time was given to answer all questions.   In office pipelle biopsy generally provides comparable results to [facility], however this sampling modality may miss focal lesions if these were previously documented on ultrasound.  It is because of the potential to miss focal lesions that hysteroscopy D&C is also warranted in patient with continued postmenopausal bleeding that is not self limited regardless of prior in office biopsy results or ultrasound findings.  She understands that the risk of continued observation include worsening bleeding or worsening of any underlying pathology.  The choices include: 1. Doing nothing but following her symptoms 2. Attempts at hormonal manipulation with either BCP or Depo-Provera for premenopausal patients with no concern for focal lesion or endometrial pathology 3. D&C/hysteroscopy. 4. Endometrial ablation via Novasure or other techniques for premenopausal patients with no concern for focal lesion or endometrial pathology  5. As final resort, hysterectomy. After consideration of her history and findings, mutual decision has been made to proceed with D+C/hysteroscopy. While the incidence is low, the risks from this surgery include, but are not limited to, the risks of anesthesia, hemorrhage, infection, perforation, and injury to adjacent structures including bowel, bladder and blood vessels.  - patient has responded well to oral progestin therapy - possible Mirena IUD placement depending on the degree of resection of the submucosal leiomyoma   2) Routine postoperative instructions were reviewed with the patient and her family in detail today including the expected length of recovery and likely postoperative course.  The patient  concurred with the proposed plan, giving informed written consent for the surgery today.  Patient instructed on the importance of being NPO after midnight prior to her procedure.  If warranted preoperative prophylactic antibiotics and SCDs ordered on call to the OR to meet SCIP guidelines and adhere to recommendation laid forth in [place] Number 104 May 2009  "Antibiotic Prophylaxis for Gynecologic Procedures".     Malachy Mood, MD, [facility], [facility] 07/16/2020, 4:58 PM

## 2020-07-17 ENCOUNTER — Other Ambulatory Visit
Admission: RE | Admit: 2020-07-17 | Discharge: 2020-07-17 | Disposition: A | Discharge status: Home / Self Care | Payer: BLUE CROSS/BLUE SHIELD | Source: Ambulatory Visit | Attending: Obstetrics and Gynecology | Admitting: Obstetrics and Gynecology

## 2020-07-17 HISTORY — DX: Anxiety disorder, unspecified: F41.9

## 2020-07-17 HISTORY — DX: Personal history of urinary calculi: Z87.442

## 2020-07-17 HISTORY — DX: Prediabetes: R73.03

## 2020-07-17 NOTE — Patient Instructions (Signed)
Your procedure is scheduled on: Thursday July 23, 2020. Report to Day Surgery inside [facility] 2nd floor. To find out your arrival time please call [phone] between 1PM - 3PM on Wednesday July 22, 2020.  Remember: Instructions that are not followed completely may result in serious medical risk,  up to and including death, or upon the discretion of your surgeon and anesthesiologist your  surgery may need to be rescheduled.     _X__ 1. Do not eat food after midnight the night before your procedure.                 No chewing gum or hard candies. You may drink clear liquids up to 2 hours                 before you are scheduled to arrive for your surgery- DO not drink clear                 liquids within 2 hours of the start of your surgery.                 Clear Liquids include:  water, apple juice without pulp, clear Gatorade, G2 or                  Gatorade Zero (avoid Red/Purple/Blue), Black Coffee or Tea (Do not add                 anything to coffee or tea).  __X__2.   Complete the "Ensure Clear Pre-surgery Clear Carbohydrate Drink" provided to you, 2 hours before arrival. **If you are diabetic you will be provided with an alternative drink, Gatorade Zero or G2.  __X__3.  On the morning of surgery brush your teeth with toothpaste and water, you                may rinse your mouth with mouthwash if you wish.  Do not swallow any toothpaste of mouthwash.     _X__ 4.  No Alcohol for 24 hours before or after surgery.   _X__ 5.  Do Not Smoke or use e-cigarettes For 24 Hours Prior to Your Surgery.                 Do not use any chewable tobacco products for at least 6 hours prior to                 Surgery.  _X__  6.  Do not use any recreational drugs (marijuana, cocaine, heroin, ecstasy, MDMA or other)                For at least one week prior to your surgery.  Combination of these drugs with anesthesia                May have life threatening  results.  __X__7.  Notify your doctor if there is any change in your medical condition      (cold, fever, infections).     Do not wear jewelry, make-up, hairpins, clips or nail polish. Do not wear lotions, powders, or perfumes. You may wear deodorant. Do not shave 48 hours prior to surgery. Men may shave face and neck. Do not bring valuables to the hospital.    [facility] is not responsible for any belongings or valuables.  Contacts, dentures or bridgework may not be worn into surgery. Leave your suitcase in the car. After surgery it may be brought to your room. For patients admitted  to the hospital, discharge time is determined by your treatment team.   Patients discharged the day of surgery will not be allowed to drive home.   Make arrangements for someone to be with you for the first 24 hours of your Same Day Discharge.   __X__ Take these medicines the morning of surgery with A SIP OF WATER:    1. None   ____ Fleet Enema (as directed)   ____ Use CHG Soap (or wipes) as directed  ____ Use Benzoyl Peroxide Gel as instructed  ____ Use inhalers on the day of surgery  ____ Stop metformin 2 days prior to surgery    ____ Take 1/2 of usual insulin dose the night before surgery. No insulin the morning          of surgery.   ____ Stop Coumadin/Plavix/aspirin   __X__ Stop Anti-inflammatories such as ibuprofen (ADVIL,MOTRIN), Aleve, naproxen, aspirin and or BC powders.    __X__ Stop supplements until after surgery.    __X__ Do not start any herbal supplements before your procedure.    If you have any questions regarding your pre-procedure instructions,  Please call Pre-admit Testing at [phone].

## 2020-07-21 ENCOUNTER — Other Ambulatory Visit
Admission: RE | Admit: 2020-07-21 | Discharge: 2020-07-21 | Disposition: A | Discharge status: Home / Self Care | Payer: PRIVATE HEALTH INSURANCE | Source: Ambulatory Visit | Attending: Obstetrics and Gynecology | Admitting: Obstetrics and Gynecology

## 2020-07-21 ENCOUNTER — Other Ambulatory Visit: Payer: Self-pay

## 2020-07-21 DIAGNOSIS — Z20822 Contact with and (suspected) exposure to covid-19: Secondary | ICD-10-CM | POA: Insufficient documentation

## 2020-07-21 DIAGNOSIS — Z01812 Encounter for preprocedural laboratory examination: Secondary | ICD-10-CM | POA: Insufficient documentation

## 2020-07-21 LAB — TYPE AND SCREEN
ABO/RH(D): A POS
Antibody Screen: NEGATIVE

## 2020-07-21 LAB — CBC
HCT: 37.1 % (ref 36.0–46.0)
Hemoglobin: 12.1 g/dL (ref 12.0–15.0)
MCH: 28.7 pg (ref 26.0–34.0)
MCHC: 32.6 g/dL (ref 30.0–36.0)
MCV: 87.9 fL (ref 80.0–100.0)
Platelets: 409 10*3/uL — ABNORMAL HIGH (ref 150–400)
RBC: 4.22 MIL/uL (ref 3.87–5.11)
RDW: 13.8 % (ref 11.5–15.5)
WBC: 13 10*3/uL — ABNORMAL HIGH (ref 4.0–10.5)
nRBC: 0 % (ref 0.0–0.2)

## 2020-07-21 LAB — SARS CORONAVIRUS 2 (TAT 6-24 HRS): SARS Coronavirus 2: NEGATIVE

## 2020-07-22 MED ORDER — ORAL CARE MOUTH RINSE: 15.0000 mL | Freq: Once | OROMUCOSAL | Status: AC

## 2020-07-22 MED ORDER — LACTATED RINGERS IV SOLN: INTRAVENOUS | Status: DC

## 2020-07-22 MED ORDER — POVIDONE-IODINE 10 % EX SWAB: 2.0000 "application " | Freq: Once | CUTANEOUS | Status: DC

## 2020-07-22 MED ORDER — CHLORHEXIDINE GLUCONATE 0.12 % MT SOLN: 15.0000 mL | Freq: Once | OROMUCOSAL | Status: AC

## 2020-07-22 MED ORDER — FAMOTIDINE 20 MG PO TABS: 20.0000 mg | ORAL_TABLET | Freq: Once | ORAL | Status: AC

## 2020-07-22 MED ORDER — LEVONORGESTREL 20 MCG/24HR IU IUD
INTRAUTERINE_SYSTEM | INTRAUTERINE | Status: DC
  Filled 2020-07-22: qty 1

## 2020-07-23 ENCOUNTER — Ambulatory Visit
Admission: RE | Admit: 2020-07-23 | Discharge: 2020-07-23 | Disposition: A | Discharge status: Home / Self Care | Payer: 59 | Attending: Obstetrics and Gynecology | Admitting: Obstetrics and Gynecology

## 2020-07-23 ENCOUNTER — Other Ambulatory Visit: Payer: Self-pay

## 2020-07-23 ENCOUNTER — Encounter
Admission: RE | Disposition: A | Discharge status: Home / Self Care | Payer: Self-pay | Source: Home / Self Care | Attending: Obstetrics and Gynecology

## 2020-07-23 ENCOUNTER — Ambulatory Visit: Payer: 59

## 2020-07-23 ENCOUNTER — Encounter: Payer: Self-pay | Admitting: Obstetrics and Gynecology

## 2020-07-23 DIAGNOSIS — Z793 Long term (current) use of hormonal contraceptives: Secondary | ICD-10-CM | POA: Insufficient documentation

## 2020-07-23 DIAGNOSIS — D250 Submucous leiomyoma of uterus: Secondary | ICD-10-CM | POA: Insufficient documentation

## 2020-07-23 DIAGNOSIS — N939 Abnormal uterine and vaginal bleeding, unspecified: Secondary | ICD-10-CM | POA: Diagnosis not present

## 2020-07-23 DIAGNOSIS — Z79899 Other long term (current) drug therapy: Secondary | ICD-10-CM | POA: Diagnosis not present

## 2020-07-23 DIAGNOSIS — N950 Postmenopausal bleeding: Secondary | ICD-10-CM | POA: Insufficient documentation

## 2020-07-23 DIAGNOSIS — Z833 Family history of diabetes mellitus: Secondary | ICD-10-CM | POA: Diagnosis not present

## 2020-07-23 DIAGNOSIS — Z87442 Personal history of urinary calculi: Secondary | ICD-10-CM | POA: Insufficient documentation

## 2020-07-23 DIAGNOSIS — Z791 Long term (current) use of non-steroidal anti-inflammatories (NSAID): Secondary | ICD-10-CM | POA: Insufficient documentation

## 2020-07-23 DIAGNOSIS — R7303 Prediabetes: Secondary | ICD-10-CM | POA: Insufficient documentation

## 2020-07-23 HISTORY — PX: HYSTEROSCOPY WITH D & C: SHX1775

## 2020-07-23 LAB — POCT PREGNANCY, URINE: Preg Test, Ur: NEGATIVE

## 2020-07-23 LAB — ABO/RH: ABO/RH(D): A POS

## 2020-07-23 SURGERY — DILATATION AND CURETTAGE /HYSTEROSCOPY
Anesthesia: General

## 2020-07-23 MED ORDER — KETOROLAC TROMETHAMINE 30 MG/ML IJ SOLN
INTRAMUSCULAR | Status: AC
  Administered 2020-07-23: 30 mg via INTRAVENOUS
  Filled 2020-07-23: qty 1

## 2020-07-23 MED ORDER — CEFAZOLIN SODIUM-DEXTROSE 2-4 GM/100ML-% IV SOLN
INTRAVENOUS | Status: AC
  Filled 2020-07-23: qty 100

## 2020-07-23 MED ORDER — DEXMEDETOMIDINE (PRECEDEX) IN NS 20 MCG/5ML (4 MCG/ML) IV SYRINGE
PREFILLED_SYRINGE | INTRAVENOUS | Status: DC | PRN
  Administered 2020-07-23 (×2): 8 ug via INTRAVENOUS
  Administered 2020-07-23: 4 ug via INTRAVENOUS

## 2020-07-23 MED ORDER — ROCURONIUM BROMIDE 10 MG/ML (PF) SYRINGE
PREFILLED_SYRINGE | INTRAVENOUS | Status: AC
  Filled 2020-07-23: qty 10

## 2020-07-23 MED ORDER — LIDOCAINE HCL (PF) 2 % IJ SOLN
INTRAMUSCULAR | Status: AC
  Filled 2020-07-23: qty 5

## 2020-07-23 MED ORDER — LEVONORGESTREL 20 MCG/24HR IU IUD
INTRAUTERINE_SYSTEM | INTRAUTERINE | Status: AC
  Filled 2020-07-23: qty 1

## 2020-07-23 MED ORDER — FENTANYL CITRATE (PF) 100 MCG/2ML IJ SOLN
INTRAMUSCULAR | Status: AC
  Filled 2020-07-23: qty 2

## 2020-07-23 MED ORDER — MIDAZOLAM HCL 2 MG/2ML IJ SOLN
INTRAMUSCULAR | Status: DC | PRN
  Administered 2020-07-23: 2 mg via INTRAVENOUS

## 2020-07-23 MED ORDER — LIDOCAINE HCL (CARDIAC) PF 100 MG/5ML IV SOSY
PREFILLED_SYRINGE | INTRAVENOUS | Status: DC | PRN
  Administered 2020-07-23: 60 mg via INTRAVENOUS

## 2020-07-23 MED ORDER — GLYCOPYRROLATE 0.2 MG/ML IJ SOLN
INTRAMUSCULAR | Status: DC | PRN
  Administered 2020-07-23: .2 mg via INTRAVENOUS

## 2020-07-23 MED ORDER — FENTANYL CITRATE (PF) 100 MCG/2ML IJ SOLN
INTRAMUSCULAR | Status: AC
  Administered 2020-07-23: 50 ug via INTRAVENOUS
  Filled 2020-07-23: qty 2

## 2020-07-23 MED ORDER — HYDROCODONE-ACETAMINOPHEN 7.5-325 MG PO TABS: 1.0000 | ORAL_TABLET | Freq: Once | ORAL | Status: DC | PRN

## 2020-07-23 MED ORDER — HYDROCODONE-ACETAMINOPHEN 5-325 MG PO TABS
1.0000 | ORAL_TABLET | Freq: Four times a day (QID) | ORAL | 0 refills | Status: DC | PRN
Start: 2020-07-23 — End: 2021-01-21

## 2020-07-23 MED ORDER — PROMETHAZINE HCL 25 MG/ML IJ SOLN: 6.2500 mg | INTRAMUSCULAR | Status: DC | PRN

## 2020-07-23 MED ORDER — PROPOFOL 10 MG/ML IV BOLUS
INTRAVENOUS | Status: DC | PRN
  Administered 2020-07-23: 200 mg via INTRAVENOUS

## 2020-07-23 MED ORDER — CHLORHEXIDINE GLUCONATE 0.12 % MT SOLN
OROMUCOSAL | Status: AC
  Administered 2020-07-23: 15 mL via OROMUCOSAL
  Filled 2020-07-23: qty 15

## 2020-07-23 MED ORDER — ACETAMINOPHEN 160 MG/5ML PO SOLN
325.0000 mg | ORAL | Status: DC | PRN
  Filled 2020-07-23: qty 20.3

## 2020-07-23 MED ORDER — DEXAMETHASONE SODIUM PHOSPHATE 10 MG/ML IJ SOLN
INTRAMUSCULAR | Status: DC | PRN
  Administered 2020-07-23: 10 mg via INTRAVENOUS

## 2020-07-23 MED ORDER — DROPERIDOL 2.5 MG/ML IJ SOLN
0.6250 mg | Freq: Once | INTRAMUSCULAR | Status: DC | PRN
  Filled 2020-07-23: qty 2

## 2020-07-23 MED ORDER — FENTANYL CITRATE (PF) 100 MCG/2ML IJ SOLN
INTRAMUSCULAR | Status: DC | PRN
  Administered 2020-07-23 (×3): 50 ug via INTRAVENOUS
  Administered 2020-07-23 (×2): 25 ug via INTRAVENOUS

## 2020-07-23 MED ORDER — ONDANSETRON HCL 4 MG/2ML IJ SOLN
INTRAMUSCULAR | Status: DC | PRN
  Administered 2020-07-23: 4 mg via INTRAVENOUS

## 2020-07-23 MED ORDER — VASOPRESSIN 20 UNIT/ML IV SOLN
INTRAVENOUS | Status: DC | PRN
  Administered 2020-07-23: 10 mL via INTRAMUSCULAR

## 2020-07-23 MED ORDER — FENTANYL CITRATE (PF) 100 MCG/2ML IJ SOLN
25.0000 ug | INTRAMUSCULAR | Status: DC | PRN
  Administered 2020-07-23: 50 ug via INTRAVENOUS

## 2020-07-23 MED ORDER — ACETAMINOPHEN 325 MG PO TABS: 325.0000 mg | ORAL_TABLET | ORAL | Status: DC | PRN

## 2020-07-23 MED ORDER — SODIUM CHLORIDE (PF) 0.9 % IJ SOLN
INTRAMUSCULAR | Status: AC
  Filled 2020-07-23: qty 50

## 2020-07-23 MED ORDER — PROMETHAZINE HCL 25 MG/ML IJ SOLN
INTRAMUSCULAR | Status: AC
  Administered 2020-07-23: 6.25 mg via INTRAVENOUS
  Filled 2020-07-23: qty 1

## 2020-07-23 MED ORDER — KETOROLAC TROMETHAMINE 30 MG/ML IJ SOLN: 30.0000 mg | Freq: Once | INTRAMUSCULAR | Status: AC | PRN

## 2020-07-23 MED ORDER — SODIUM CHLORIDE FLUSH 0.9 % IV SOLN
INTRAVENOUS | Status: AC
  Filled 2020-07-23: qty 10

## 2020-07-23 MED ORDER — FAMOTIDINE 20 MG PO TABS
ORAL_TABLET | ORAL | Status: AC
  Administered 2020-07-23: 20 mg via ORAL
  Filled 2020-07-23: qty 1

## 2020-07-23 MED ORDER — SUCCINYLCHOLINE CHLORIDE 200 MG/10ML IV SOSY
PREFILLED_SYRINGE | INTRAVENOUS | Status: AC
  Filled 2020-07-23: qty 10

## 2020-07-23 MED ORDER — DEXMEDETOMIDINE (PRECEDEX) IN NS 20 MCG/5ML (4 MCG/ML) IV SYRINGE
PREFILLED_SYRINGE | INTRAVENOUS | Status: AC
  Filled 2020-07-23: qty 5

## 2020-07-23 MED ORDER — MIDAZOLAM HCL 2 MG/2ML IJ SOLN
INTRAMUSCULAR | Status: AC
  Filled 2020-07-23: qty 2

## 2020-07-23 MED ORDER — ESMOLOL HCL 100 MG/10ML IV SOLN
INTRAVENOUS | Status: DC | PRN
  Administered 2020-07-23: 10 mg via INTRAVENOUS

## 2020-07-23 MED ORDER — VASOPRESSIN 20 UNIT/ML IV SOLN
INTRAVENOUS | Status: AC
  Filled 2020-07-23: qty 1

## 2020-07-23 MED ORDER — ONDANSETRON HCL 4 MG/2ML IJ SOLN
INTRAMUSCULAR | Status: AC
  Filled 2020-07-23: qty 2

## 2020-07-23 MED ORDER — DEXAMETHASONE SODIUM PHOSPHATE 10 MG/ML IJ SOLN
INTRAMUSCULAR | Status: AC
  Filled 2020-07-23: qty 1

## 2020-07-23 SURGICAL SUPPLY — 28 items
ABLATOR ENDOMETRIAL MYOSURE (ABLATOR) ×2 IMPLANT
CATH ROBINSON RED A/P 16FR (CATHETERS) ×2 IMPLANT
COVER WAND RF STERILE (DRAPES) IMPLANT
DEVICE MYOSURE LITE (MISCELLANEOUS) IMPLANT
ELECT REM PT RETURN 9FT ADLT (ELECTROSURGICAL)
ELECTRODE REM PT RTRN 9FT ADLT (ELECTROSURGICAL) IMPLANT
GLOVE BIO SURGEON STRL SZ7 (GLOVE) ×2 IMPLANT
GLOVE INDICATOR 7.5 STRL GRN (GLOVE) ×2 IMPLANT
GOWN STRL REUS W/ TWL LRG LVL3 (GOWN DISPOSABLE) ×2 IMPLANT
GOWN STRL REUS W/TWL LRG LVL3 (GOWN DISPOSABLE) ×4
INFUSOR MANOMETER BAG 3000ML (MISCELLANEOUS) IMPLANT
IV LACTATED RINGER IRRG 3000ML (IV SOLUTION)
IV LR IRRIG 3000ML ARTHROMATIC (IV SOLUTION) IMPLANT
IV NS IRRIG 3000ML ARTHROMATIC (IV SOLUTION) ×2 IMPLANT
KIT PROCEDURE FLUENT (KITS) ×2 IMPLANT
KIT TURNOVER CYSTO (KITS) ×2 IMPLANT
MANIFOLD NEPTUNE II (INSTRUMENTS) ×2 IMPLANT
MYOSURE XL FIBROID (MISCELLANEOUS)
NDL DEFLUX 3.7X23X350 (MISCELLANEOUS) ×2
NEEDLE DEFLUX 3.7X23X350 (MISCELLANEOUS) ×1 IMPLANT
PACK DNC HYST (MISCELLANEOUS) ×2 IMPLANT
PAD OB MATERNITY 4.3X12.25 (PERSONAL CARE ITEMS) ×2 IMPLANT
PAD PREP 24X41 OB/GYN DISP (PERSONAL CARE ITEMS) ×2 IMPLANT
SEAL ROD LENS SCOPE MYOSURE (ABLATOR) ×2 IMPLANT
SYR 10ML LL (SYRINGE) ×2 IMPLANT
SYSTEM TISS REMOVAL MYOSURE XL (MISCELLANEOUS) IMPLANT
TOWEL OR 17X26 4PK STRL BLUE (TOWEL DISPOSABLE) ×2 IMPLANT
TUBING CONNECTING 10 (TUBING) ×2 IMPLANT

## 2020-07-23 NOTE — Interval H&P Note (Signed)
History and Physical Interval Note:  07/23/2020 2:59 PM  Heidi Allen  has presented today for surgery, with the diagnosis of AUB-L N93.9, D25.0, D25.1, D25.2.  The various methods of treatment have been discussed with the patient and family. After consideration of risks, benefits and other options for treatment, the patient has consented to  Procedure(s): DILATATION AND CURETTAGE /HYSTEROSCOPY (N/A) as a surgical intervention.  The patient's history has been reviewed, patient examined, no change in status, stable for surgery.  I have reviewed the patient's chart and labs.  Questions were answered to the patient's satisfaction.     Malachy Mood

## 2020-07-23 NOTE — Anesthesia Preprocedure Evaluation (Signed)
Anesthesia Evaluation  Patient identified by MRN, date of birth, ID band Patient awake    Reviewed: Allergy & Precautions, H&P , NPO status , reviewed documented beta blocker date and time   Airway Mallampati: II  TM Distance: >3 FB Neck ROM: full    Dental  (+) Caps, Teeth Intact   Pulmonary    Pulmonary exam normal        Cardiovascular Normal cardiovascular exam     Neuro/Psych PSYCHIATRIC DISORDERS Anxiety    GI/Hepatic neg GERD  ,  Endo/Other    Renal/GU      Musculoskeletal   Abdominal   Peds  Hematology   Anesthesia Other Findings Past Medical History: No date: Anxiety No date: History of kidney stones No date: Pre-diabetes Past Surgical History: 2011: LITHOTRIPSY 1999: TUBAL LIGATION BMI    Body Mass Index: 30.07 kg/m     Reproductive/Obstetrics                             Anesthesia Physical Anesthesia Plan  ASA: II  Anesthesia Plan: General   Post-op Pain Management:    Induction: Intravenous  PONV Risk Score and Plan: 3 and Ondansetron, Midazolam and Treatment may vary due to age or medical condition  Airway Management Planned: LMA  Additional Equipment:   Intra-op Plan:   Post-operative Plan: Extubation in OR  Informed Consent: I have reviewed the patients History and Physical, chart, labs and discussed the procedure including the risks, benefits and alternatives for the proposed anesthesia with the patient or authorized representative who has indicated his/her understanding and acceptance.     Dental Advisory Given  Plan Discussed with: CRNA  Anesthesia Plan Comments:         Anesthesia Quick Evaluation

## 2020-07-23 NOTE — Discharge Instructions (Signed)

## 2020-07-23 NOTE — Anesthesia Postprocedure Evaluation (Signed)
Anesthesia Post Note  Patient: Heidi Allen  Procedure(s) Performed: DILATATION AND CURETTAGE /HYSTEROSCOPY/ with myosure (N/A )  Patient location during evaluation: PACU Anesthesia Type: General Level of consciousness: awake and alert Pain management: pain level controlled Vital Signs Assessment: post-procedure vital signs reviewed and stable Respiratory status: spontaneous breathing, nonlabored ventilation, respiratory function stable and patient connected to nasal cannula oxygen Cardiovascular status: blood pressure returned to baseline and stable Postop Assessment: no apparent nausea or vomiting Anesthetic complications: no   No complications documented.   Last Vitals:  Vitals:   07/23/20 1501 07/23/20 1645  BP:  124/79  Pulse: (!) 113 74  Resp:  13  Temp:  (!) 36 C  SpO2:  98%    Last Pain:  Vitals:   07/23/20 1645  TempSrc:   PainSc: Asleep                 Molli Barrows

## 2020-07-23 NOTE — Transfer of Care (Signed)
Immediate Anesthesia Transfer of Care Note  Patient: Heidi Allen  Procedure(s) Performed: DILATATION AND CURETTAGE /HYSTEROSCOPY/ with myosure (N/A )  Patient Location: PACU  Anesthesia Type:General  Level of Consciousness: drowsy  Airway & Oxygen Therapy: Patient Spontanous Breathing and Patient connected to face mask oxygen  Post-op Assessment: Report given to RN and Post -op Vital signs reviewed and stable  Post vital signs: Reviewed and stable  Last Vitals:  Vitals Value Taken Time  BP 124/79 07/23/20 1643  Temp    Pulse 72 07/23/20 1645  Resp 13 07/23/20 1645  SpO2 97 % 07/23/20 1645  Vitals shown include unvalidated device data.  Last Pain:  Vitals:   07/23/20 1230  TempSrc: Oral  PainSc: 0-No pain         Complications: No complications documented.

## 2020-07-23 NOTE — Op Note (Signed)
Preoperative Diagnosis: 1) 49 y.o. with abnormal uterine bleeding 2) Uterine fibroids  Postoperative Diagnosis: 1) 49 y.o. with abnormal uterine bleeding 2) Uterine fibroids  Operation Performed: Hysteroscopic myomectomy, targeted dilation and curettage   Indication: Abnormal uterine bleeding secondary to submucosal fibroids  Anesthesia: General  Primary Surgeon: Malachy Mood, MD  Assistant: none  Preoperative Antibiotics: none  Estimated Blood Loss: 5 mL  IV Fluids: 582mL  Urine Output:: ~51mL straight cath  Fluid Deficit: 462mL normal saline  Drains or Tubes: none  Implants: none  Specimens Removed: endometrial curettings and uterine fibroids  Complications: none  Intraoperative Findings:  Normal cervix, two submucosal fibroids approximately 1.5-2.0cm one anterior fundal the other right lower uterine segment approximately 75% and 50% submucosal respectively.  Patient Condition: stable  Procedure in Detail:  Patient was taken to the operating room were she was administered general endotracheal anesthesia.  She was positioned in the dorsal lithotomy position utilizing Allen stirups, prepped and draped in the usual sterile fashion.  Uterus was noted to be non-enlarged, anteverted.   Prior to proceeding with the case a time out was performed.  Attention was turned to the patient's pelvis.  A red rubber catheter was used to empty the patient's bladder.  An operative speculum was placed to allow visualization of the cervix.  The anterior lip of the cervix was grasped with a single tooth tenaculum and the cervix was sequentially dilated using pratt dilators.  The hysteroscope was then advanced into the uterine cavity noting the above findings.  The two submucosal fibroids were injected with dilute vasopressin using an D-flux needle.  The fibroids were then resected using the Myosure.  Curettage was performed using a the mysoure to obtain a 4 quadrant endometrial sampling.The  resulting specimen was collected and sent to pathology.    The single tooth tenaculum was removed from the cervix.  The tenaculum sites and cervix were noted to be  Hemostatic before removing the operative speculum.  Sponge needle and instrument counts were corrects times two.  The patient tolerated the procedure well and was taken to the recovery room in stable condition.

## 2020-07-23 NOTE — Anesthesia Procedure Notes (Signed)
Procedure Name: LMA Insertion Date/Time: 07/23/2020 3:35 PM Performed by: Kelton Pillar, CRNA Pre-anesthesia Checklist: Patient identified, Emergency Drugs available, Suction available, Patient being monitored and Timeout performed Patient Re-evaluated:Patient Re-evaluated prior to induction Oxygen Delivery Method: Circle system utilized Preoxygenation: Pre-oxygenation with 100% oxygen Induction Type: IV induction LMA: LMA inserted LMA Size: 3.5 Tube type: Oral Number of attempts: 1 Placement Confirmation: positive ETCO2 and breath sounds checked- equal and bilateral Tube secured with: Tape Dental Injury: Teeth and Oropharynx as per pre-operative assessment

## 2020-07-24 ENCOUNTER — Encounter: Payer: Self-pay | Admitting: Obstetrics and Gynecology

## 2020-07-27 ENCOUNTER — Encounter: Payer: Self-pay | Admitting: Obstetrics and Gynecology

## 2020-07-27 ENCOUNTER — Ambulatory Visit (INDEPENDENT_AMBULATORY_CARE_PROVIDER_SITE_OTHER): Payer: 59 | Admitting: Obstetrics and Gynecology

## 2020-07-27 ENCOUNTER — Other Ambulatory Visit: Payer: Self-pay

## 2020-07-27 ENCOUNTER — Telehealth: Payer: Self-pay | Admitting: Obstetrics and Gynecology

## 2020-07-27 VITALS — BP 142/90 | Ht 67.0 in | Wt 207.0 lb

## 2020-07-27 DIAGNOSIS — Z4889 Encounter for other specified surgical aftercare: Secondary | ICD-10-CM

## 2020-07-27 LAB — SURGICAL PATHOLOGY

## 2020-07-27 NOTE — Progress Notes (Signed)
      Postoperative Follow-up Patient presents post op from hysteroscopic myomectomy 1weeks ago for abnormal uterine bleeding.  Subjective: Patient reports some improvement in her preop symptoms. Eating a regular diet without difficulty. Pain is controlled without any medications.  Activity: normal activities of daily living. Minimal spotting since procedure has not had to wear a pad.  Objective: Last menstrual period 07/07/2020.  General: NAD Pulmonary: no increased work of breathing Extremities: no edema Neurologic: normal gait    Admission on 07/23/2020, Discharged on 07/23/2020  Component Date Value Ref Range Status  . ABO/RH(D) 07/23/2020    Final                   Value:A POS Performed at [facility], [address] [place], [place] [address]   . Preg Test, Ur 07/23/2020 NEGATIVE  NEGATIVE Final   Comment:        THE SENSITIVITY OF THIS METHODOLOGY IS >24 mIU/mL     Assessment: 49 y.o. s/p hysteroscopic myomectomy for AUB-L with two submucosal fibroids stable  Plan: Patient has done well after surgery with no apparent complications.  I have discussed the post-operative course to date, and the expected progress moving forward.  The patient understands what complications to be concerned about.  I will see the patient in routine follow up, or sooner if needed.    Activity plan: No restriction.  Plan on Mirena IUD placement at 6 week postoperative visit. Pathology pending   Malachy Mood, MD, [facility], [facility] 07/27/2020, 9:34 AM

## 2020-07-27 NOTE — Telephone Encounter (Signed)
Patient is scheduled for AMS on 09/01/20 at 9:30 for 6 week postop and Mirena IUD insertion.

## 2020-07-28 NOTE — Telephone Encounter (Signed)
Noted. Will order to arrive by apt date/time. 

## 2020-08-06 ENCOUNTER — Ambulatory Visit: Payer: 59 | Admitting: Obstetrics and Gynecology

## 2020-08-18 ENCOUNTER — Other Ambulatory Visit: Payer: Self-pay | Admitting: Obstetrics and Gynecology

## 2020-09-01 ENCOUNTER — Encounter: Payer: Self-pay | Admitting: Obstetrics and Gynecology

## 2020-09-01 ENCOUNTER — Ambulatory Visit (INDEPENDENT_AMBULATORY_CARE_PROVIDER_SITE_OTHER): Payer: 59 | Admitting: Obstetrics and Gynecology

## 2020-09-01 ENCOUNTER — Other Ambulatory Visit: Payer: Self-pay

## 2020-09-01 VITALS — BP 132/86 | Ht 67.0 in | Wt 211.0 lb

## 2020-09-01 DIAGNOSIS — Z4889 Encounter for other specified surgical aftercare: Secondary | ICD-10-CM

## 2020-09-01 DIAGNOSIS — Z30430 Encounter for insertion of intrauterine contraceptive device: Secondary | ICD-10-CM | POA: Diagnosis not present

## 2020-09-01 MED ORDER — FLUCONAZOLE 150 MG PO TABS: 150.0000 mg | ORAL_TABLET | Freq: Once | ORAL | 0 refills | Status: AC

## 2020-09-01 NOTE — Progress Notes (Signed)
Postoperative Follow-up Patient presents post op from hysteroscopy, D&C, myomectomy 6weeks ago for abnormal uterine bleeding and fibroids.  Subjective: Patient reports marked improvement in her preop symptoms. Eating a regular diet without difficulty. The patient is not having any pain.  Activity: normal activities of daily living.  Objective: Blood pressure 132/86, height 5\' 7"  (1.702 m), weight 211 lb (95.7 kg).  General: NAD Pulmonary: no increased work of breathing Abdomen: soft, non-tender, non-distended, incision(s) D/C/I GU: normal external female genitalia normal cervix, no CMT, uterus normal in shape and contour, no adnexal tenderness or masses Extremities: no edema Neurologic: normal gait    GYNECOLOGY OFFICE PROCEDURE NOTE  MARESA MORASH is a 49 y.o. G3P3 here for a Mirena IUD insertion. No GYN concerns.  Last pap smear was on 06/13/2019 and was normal.  The patient is currently using provera for contraception and her LMP is No LMP recorded. (Menstrual status: Irregular Periods)..  The indication for her IUD is contraception/cycle control.  IUD Insertion Procedure Note Patient identified, informed consent performed, consent signed.   Discussed risks of irregular bleeding, cramping, infection, malpositioning, expulsion or uterine perforation of the IUD (1:1000 placements)  which may require further procedure such as laparoscopy.  IUD while effective at preventing pregnancy do not prevent transmission of sexually transmitted diseases and use of barrier methods for this purpose was discussed. Time out was performed.  Urine pregnancy test negative.  Speculum placed in the vagina.  Cervix visualized.  Cleaned with Betadine x 2.  Grasped anteriorly with a single tooth tenaculum.  Uterus sounded to 7 cm. IUD placed per manufacturer's recommendations.  Strings trimmed to 3 cm. Tenaculum was removed, good hemostasis noted.  Patient tolerated procedure well.   Patient was given  post-procedure instructions.  She was advised to have backup contraception for one week.  Patient was also asked to check IUD strings periodically and follow up in 6 weeks for IUD check.    Admission on 07/23/2020, Discharged on 07/23/2020  Component Date Value Ref Range Status  . ABO/RH(D) 07/23/2020    Final                   Value:A POS Performed at [facility], [address] [place], [place]   . Preg Test, Ur 07/23/2020 NEGATIVE  NEGATIVE Final   Comment:        THE SENSITIVITY OF THIS METHODOLOGY IS >24 mIU/mL   . SURGICAL PATHOLOGY 07/23/2020    Final-Edited                   Value:SURGICAL PATHOLOGY CASE: [id number] PATIENT: Jaelyn Malone Surgical Pathology Report     Specimen Submitted: A. Endometrial currettings and fibroid  Clinical History: AUB-L N93.9, D25.0, D25.1, D25.2    DIAGNOSIS: A. UTERUS; CURETTAGE AND MYOMECTOMY: - FRAGMENTS OF BENIGN SMOOTH MUSCLE, COMPATIBLE WITH LEIOMYOMATA UTERI. - PORTIONS OF WEAKLY PROLIFERATIVE ENDOMETRIUM. - NEGATIVE FOR ATYPICAL HYPERPLASIA/EIN AND MALIGNANCY.  GROSS DESCRIPTION: A. Labeled: Endometrial curettings and fibroid Received: In formalin Tissue fragment(s): Multiple Size: 2.5 x 2.5 x 0.6 cm Description: Aggregate of tan-white tissue fragments Entirely submitted in 2 cassettes.   Final Diagnosis performed by 05-15-1980, MD.   Electronically signed 07/27/2020 12:15:46PM The electronic signature indicates that the named Attending Pathologist has evaluated the specimen Technical component performed at [facility], [address] [place], [place] Lab: [phone] Dir: [phone] Na  Augustin Coupe, MD, MMM  Professional component performed at [facility], [facility], [address] [place], [place], [place] [address] Lab: [phone] Dir: Georgiann Cocker. Oneita Kras, MD     Assessment: 49 y.o. s/p hysteroscopyc, D&C, myomectomy stable, IUD placement  today  Plan: Patient has done well after surgery with no apparent complications.  I have discussed the post-operative course to date, and the expected progress moving forward.  The patient understands what complications to be concerned about.  I will see the patient in routine follow up, or sooner if needed.    Activity plan: No restriction.  Currently on Keflex for UTI given also betadine used today will call in diflucan of candida symptoms develo  Can stop provera, 6 week string check   Vena Austria, MD, [facility], [facility] 09/01/2020, 9:43 AM

## 2020-10-12 ENCOUNTER — Ambulatory Visit: Payer: 59 | Admitting: Obstetrics and Gynecology

## 2020-10-15 ENCOUNTER — Encounter: Payer: Self-pay | Admitting: Obstetrics and Gynecology

## 2020-10-15 ENCOUNTER — Other Ambulatory Visit: Payer: Self-pay

## 2020-10-15 ENCOUNTER — Ambulatory Visit (INDEPENDENT_AMBULATORY_CARE_PROVIDER_SITE_OTHER): Payer: 59 | Admitting: Obstetrics and Gynecology

## 2020-10-15 VITALS — BP 150/99 | Ht 67.0 in | Wt 207.0 lb

## 2020-10-15 DIAGNOSIS — R030 Elevated blood-pressure reading, without diagnosis of hypertension: Secondary | ICD-10-CM

## 2020-10-15 DIAGNOSIS — Z30431 Encounter for routine checking of intrauterine contraceptive device: Secondary | ICD-10-CM | POA: Diagnosis not present

## 2020-10-15 NOTE — Progress Notes (Signed)
Obstetrics & Gynecology Office Visit   Chief Complaint:  Chief Complaint  Patient presents with  . Follow-up    IUD string check - Still on and off spotting.  RM 3    History of Present Illness: 50 y.o. patient presenting for follow up of Mirena IUD placement 6+ weeks ago.  The indication for her IUD was cycle control.  She denies any complications since her IUD placement.  Still having some occasional spotting.  is not able to feel strings.    Review of Systems: Review of Systems  Constitutional: Negative.   Gastrointestinal: Negative.   Genitourinary: Negative.     Past Medical History:  Past Medical History:  Diagnosis Date  . Anxiety   . History of kidney stones   . Pre-diabetes     Past Surgical History:  Past Surgical History:  Procedure Laterality Date  . HYSTEROSCOPY WITH D & C N/A 07/23/2020   Procedure: DILATATION AND CURETTAGE /HYSTEROSCOPY/ with myosure;  Surgeon: Malachy Mood, MD;  Location: [facility] ORS;  Service: Gynecology;  Laterality: N/A;  . LITHOTRIPSY  2011  . TUBAL LIGATION  1999    Gynecologic History: No LMP recorded. (Menstrual status: IUD).  Obstetric History: G3P3  Family History:  Family History  Problem Relation Age of Onset  . Diabetes Mother   . Diabetes Father     Social History:  Social History   Socioeconomic History  . Marital status: Married    Spouse name: Not on file  . Number of children: Not on file  . Years of education: Not on file  . Highest education level: Not on file  Occupational History  . Not on file  Tobacco Use  . Smoking status: Never Smoker  . Smokeless tobacco: Never Used  Vaping Use  . Vaping Use: Never used  Substance and Sexual Activity  . Alcohol use: No  . Drug use: No  . Sexual activity: Yes    Birth control/protection: None  Other Topics Concern  . Not on file  Social History Narrative  . Not on file   Social Determinants of Health   Financial Resource Strain: Not on file   Food Insecurity: Not on file  Transportation Needs: Not on file  Physical Activity: Not on file  Stress: Not on file  Social Connections: Not on file  Intimate Partner Violence: Not on file    Allergies:  No Known Allergies  Medications: Prior to Admission medications   Medication Sig Start Date End Date Taking? Authorizing Provider  ferrous sulfate 325 (65 FE) MG tablet Take by mouth.    Yes [provider]  ibuprofen (ADVIL,MOTRIN) 600 MG tablet Take 1 tablet (600 mg total) by mouth every 6 (six) hours as needed for moderate pain. 03/23/15  Yes Joanne Gavel, MD  Multiple Vitamin (MULTI-VITAMINS) TABS Take by mouth.    Yes [provider]  cephALEXin (KEFLEX) 500 MG capsule Take 500 mg by mouth 2 (two) times daily. Patient not taking: Reported on 10/15/2020 08/31/20   [provider]  HYDROcodone-acetaminophen (NORCO/VICODIN) 5-325 MG tablet Take 1 tablet by mouth every 6 (six) hours as needed. Patient not taking: Reported on 09/01/2020 07/23/20   Malachy Mood, MD  ipratropium (ATROVENT) 0.06 % nasal spray Place into both nostrils.  Patient not taking: Reported on 09/01/2020 04/24/20   [provider]  medroxyPROGESTERone (PROVERA) 10 MG tablet Take 2 tablets (20 mg total) by mouth daily. Patient not taking: Reported on 10/15/2020 08/22/20  Malachy Mood, MD    Physical Exam Blood pressure (!) 182/93, height 5\' 7"  (1.702 m), weight 207 lb (93.9 kg). No LMP recorded. (Menstrual status: IUD).  General: NAD HEENT: normocephalic, anicteric Pulmonary: No increased work of breathing  Genitourinary:  External: Normal external female genitalia.  Normal urethral meatus, normal  Bartholin's and Skene's glands.    Vagina: Normal vaginal mucosa, no evidence of prolapse.    Cervix: Grossly normal in appearance, no bleeding, IUD strings visualized flush at cervical os  Uterus: Non-enlarged, mobile, normal contour.  No CMT  Adnexa: ovaries  non-enlarged, no adnexal masses  Rectal: deferred  Lymphatic: no evidence of inguinal lymphadenopathy Extremities: no edema, erythema, or tenderness Neurologic: Grossly intact Psychiatric: mood appropriate, affect full  Female chaperone present for pelvic and breast  portions of the physical exam  Assessment: 50 y.o. G3P3 IUD string check  Plan: Problem List Items Addressed This Visit   None   Visit Diagnoses    IUD check up    -  Primary   Elevated blood pressure reading           1.  The patient was given instructions to check her IUD strings monthly and call with any problems or concerns.  She should call for fevers, chills, abnormal vaginal discharge, pelvic pain, or other complaints.  2.   IUDs while effective at preventing pregnancy do not prevent transmission of sexually transmitted diseases and use of barrier methods for this purpose was discussed.  Low overall incidence of failure with 99.7% efficacy rate in typical use.  The patient has not contraindication to IUD placement.  3.  She will return for a annual exam in 1 year.  All questions answered.  4) A total of 15 minutes were spent in face-to-face contact with the patient during this encounter with over half of that time devoted to counseling and coordination of care.  5) Return in about 1 year (around 10/15/2021) for annual.   Malachy Mood, MD, [place], [facility] 10/15/2020, 3:50 PM

## 2021-01-07 ENCOUNTER — Other Ambulatory Visit: Payer: Self-pay | Admitting: Family Medicine

## 2021-01-07 DIAGNOSIS — Z1231 Encounter for screening mammogram for malignant neoplasm of breast: Secondary | ICD-10-CM

## 2021-01-11 ENCOUNTER — Inpatient Hospital Stay
Admission: RE | Admit: 2021-01-11 | Discharge: 2021-01-11 | Disposition: A | Discharge status: Home / Self Care | Payer: Self-pay | Source: Ambulatory Visit | Attending: *Deleted | Admitting: *Deleted

## 2021-01-11 ENCOUNTER — Other Ambulatory Visit: Payer: Self-pay | Admitting: *Deleted

## 2021-01-11 ENCOUNTER — Other Ambulatory Visit: Payer: Self-pay

## 2021-01-11 ENCOUNTER — Ambulatory Visit
Admission: RE | Admit: 2021-01-11 | Discharge: 2021-01-11 | Disposition: A | Discharge status: Home / Self Care | Payer: 59 | Source: Ambulatory Visit | Attending: Family Medicine | Admitting: Family Medicine

## 2021-01-11 DIAGNOSIS — Z1231 Encounter for screening mammogram for malignant neoplasm of breast: Secondary | ICD-10-CM

## 2021-01-20 ENCOUNTER — Other Ambulatory Visit: Payer: Self-pay

## 2021-01-20 ENCOUNTER — Emergency Department
Admission: EM | Admit: 2021-01-20 | Discharge: 2021-01-20 | Disposition: A | Discharge status: Home / Self Care | Payer: 59 | Attending: Emergency Medicine | Admitting: Emergency Medicine

## 2021-01-20 ENCOUNTER — Emergency Department: Payer: 59

## 2021-01-20 ENCOUNTER — Encounter: Payer: Self-pay | Admitting: Emergency Medicine

## 2021-01-20 DIAGNOSIS — N939 Abnormal uterine and vaginal bleeding, unspecified: Secondary | ICD-10-CM | POA: Diagnosis present

## 2021-01-20 DIAGNOSIS — R1031 Right lower quadrant pain: Secondary | ICD-10-CM

## 2021-01-20 LAB — BASIC METABOLIC PANEL
Anion gap: 11 (ref 5–15)
BUN: 14 mg/dL (ref 6–20)
CO2: 24 mmol/L (ref 22–32)
Calcium: 9.3 mg/dL (ref 8.9–10.3)
Chloride: 101 mmol/L (ref 98–111)
Creatinine, Ser: 0.57 mg/dL (ref 0.44–1.00)
GFR, Estimated: >60 mL/min (ref >60)
Glucose, Bld: 202 mg/dL — ABNORMAL HIGH (ref 70–99)
Potassium: 3.5 mmol/L (ref 3.5–5.1)
Sodium: 136 mmol/L (ref 135–145)

## 2021-01-20 LAB — CBC
HCT: 32.3 % — ABNORMAL LOW (ref 36.0–46.0)
Hemoglobin: 10.7 g/dL — ABNORMAL LOW (ref 12.0–15.0)
MCH: 27 pg (ref 26.0–34.0)
MCHC: 33.1 g/dL (ref 30.0–36.0)
MCV: 81.6 fL (ref 80.0–100.0)
Platelets: 395 10*3/uL (ref 150–400)
RBC: 3.96 MIL/uL (ref 3.87–5.11)
RDW: 16.1 % — ABNORMAL HIGH (ref 11.5–15.5)
WBC: 14.2 10*3/uL — ABNORMAL HIGH (ref 4.0–10.5)
nRBC: 0 % (ref 0.0–0.2)

## 2021-01-20 LAB — POC URINE PREG, ED: Preg Test, Ur: NEGATIVE

## 2021-01-20 NOTE — Telephone Encounter (Signed)
Patient reports she had surgery with AMS in November. She had an IUD placed. On Monday she was filling up a 10 hour pad within an hour. Today she's passing huge (baseball sized) blood clots. She was anemic. She doesn't feel the best. Cb#[phone]

## 2021-01-20 NOTE — Telephone Encounter (Signed)
Spoke w/patient. Advised to report to ED.

## 2021-01-20 NOTE — ED Notes (Signed)
Entered room to DC pt, pt has already left without DC paper work.

## 2021-01-20 NOTE — ED Provider Notes (Signed)
[facility] Emergency Department Provider Note  ____________________________________________   Event Date/Time   First MD Initiated Contact with Patient 01/20/21 1820     (approximate)  I have reviewed the triage vital signs and the nursing notes.   HISTORY  Chief Complaint Vaginal Bleeding   HPI Heidi Allen is a 50 y.o. female presents to the ED with complaint of vaginal bleeding that began on Monday.  Patient states that she passed multiple clots days ago and then yesterday her bleeding became manageable.  Patient states that time of her heaviest bleeding she was changing a large pad approximately every 30 minutes.  She reports that her flow slowed down today until she passed another large clot.  Patient has a history of fibroids and also history of anemia.  Patient has spoken to Dr. Georgianne Fick at [facility] who will see her in the office tomorrow.  His 1 concern is that the eye you do that he placed may have implanted into the uterine wall causing patient's current situation.  Also her hemoglobin in November was 12.  Patient denies any pain at this time.         Past Medical History:  Diagnosis Date  . Anxiety   . History of kidney stones   . Pre-diabetes     Patient Active Problem List   Diagnosis Date Noted  . Fibroids, submucosal   . Abnormal uterine bleeding (AUB)     Past Surgical History:  Procedure Laterality Date  . HYSTEROSCOPY WITH D & C N/A 07/23/2020   Procedure: DILATATION AND CURETTAGE /HYSTEROSCOPY/ with myosure;  Surgeon: Malachy Mood, MD;  Location: [facility] ORS;  Service: Gynecology;  Laterality: N/A;  . LITHOTRIPSY  2011  . TUBAL LIGATION  1999    Prior to Admission medications   Medication Sig Start Date End Date Taking? Authorizing Provider  cephALEXin (KEFLEX) 500 MG capsule Take 500 mg by mouth 2 (two) times daily. Patient not taking: Reported on 10/15/2020 08/31/20   [provider]  ferrous sulfate  325 (65 FE) MG tablet Take by mouth.     [provider]  HYDROcodone-acetaminophen (NORCO/VICODIN) 5-325 MG tablet Take 1 tablet by mouth every 6 (six) hours as needed. Patient not taking: Reported on 09/01/2020 07/23/20   Malachy Mood, MD  ibuprofen (ADVIL,MOTRIN) 600 MG tablet Take 1 tablet (600 mg total) by mouth every 6 (six) hours as needed for moderate pain. 03/23/15   Joanne Gavel, MD  ipratropium (ATROVENT) 0.06 % nasal spray Place into both nostrils.  Patient not taking: Reported on 09/01/2020 04/24/20   [provider]  medroxyPROGESTERone (PROVERA) 10 MG tablet Take 2 tablets (20 mg total) by mouth daily. Patient not taking: Reported on 10/15/2020 08/22/20   Malachy Mood, MD  Multiple Vitamin (MULTI-VITAMINS) TABS Take by mouth.     [provider]    Allergies Patient has no known allergies.  Family History  Problem Relation Age of Onset  . Diabetes Mother   . Breast cancer Mother 67  . Diabetes Father     Social History Social History   Tobacco Use  . Smoking status: Never Smoker  . Smokeless tobacco: Never Used  Vaping Use  . Vaping Use: Never used  Substance Use Topics  . Alcohol use: No  . Drug use: No    Review of Systems Constitutional: No fever/chills Eyes: No visual changes. Cardiovascular: Denies chest pain. Respiratory: Denies shortness of breath. Gastrointestinal: No abdominal pain.  No nausea,  no vomiting.   Genitourinary: Negative for dysuria.  Positive for vaginal bleeding x3 days. Musculoskeletal: Negative for musculoskeletal pain. Skin: Negative for rash. Neurological: Negative for headaches, focal weakness or numbness. ____________________________________________   PHYSICAL EXAM:  VITAL SIGNS: ED Triage Vitals [01/20/21 1713]  Enc Vitals Group     BP (!) 153/86     Pulse Rate (!) 108     Resp 18     Temp 98.3 F (36.8 C)     Temp Source Oral     SpO2 98 %     Weight 197 lb (89.4 kg)      Height 5\' 7"  (1.702 m)     Head Circumference      Peak Flow      Pain Score 0     Pain Loc      Pain Edu?      Excl. in [place]?     Constitutional: Alert and oriented. Well appearing and in no acute distress. Eyes: Conjunctivae are normal. PERRL. EOMI. Head: Atraumatic. Neck: No stridor.   Cardiovascular: Normal rate, regular rhythm. Grossly normal heart sounds.  Good peripheral circulation. Respiratory: Normal respiratory effort.  No retractions. Lungs CTAB. Gastrointestinal: Soft and nontender. No distention.  Neurologic:  Normal speech and language. No gross focal neurologic deficits are appreciated. No gait instability. Skin:  Skin is warm, dry and intact.  Psychiatric: Mood and affect are normal. Speech and behavior are normal.  ____________________________________________   LABS (all labs ordered are listed, but only abnormal results are displayed)  Labs Reviewed  CBC - Abnormal; Notable for the following components:      Result Value   WBC 14.2 (*)    Hemoglobin 10.7 (*)    HCT 32.3 (*)    RDW 16.1 (*)    All other components within normal limits  BASIC METABOLIC PANEL - Abnormal; Notable for the following components:   Glucose, Bld 202 (*)    All other components within normal limits  POC URINE PREG, ED   ____________________________________________  EKG   ____________________________________________  RADIOLOGY Leana Gamer, personally viewed and evaluated these images (plain radiographs) as part of my medical decision making, as well as reviewing the written report by the radiologist.   Official radiology report(s): No results found.  ____________________________________________   PROCEDURES  Procedure(s) performed (including Critical Care):  Procedures   ____________________________________________   INITIAL IMPRESSION / ASSESSMENT AND PLAN / ED COURSE  As part of my medical decision making, I reviewed the following data within the  electronic MEDICAL RECORD NUMBER Notes from prior ED visits and [place] Controlled Substance Database  50 year old female presents to the ED with history of vaginal bleeding for the last 3 days.  A transvaginal ultrasound was ordered and had not been done at the time of this provider's handoff to Dr. Ellender Hose who is taking over the care of this patient.  Vital signs are stable.  ____________________________________________   FINAL CLINICAL IMPRESSION(S) / ED DIAGNOSES  Final diagnoses:  Vaginal bleeding  RLQ abdominal pain     ED Discharge Orders    None      *Please note:  Heidi Allen was evaluated in Emergency Department on 01/20/2021 for the symptoms described in the history of present illness. She was evaluated in the context of the global COVID-19 pandemic, which necessitated consideration that the patient might be at risk for infection with the SARS-CoV-2 virus that causes COVID-19. Institutional protocols and algorithms that pertain to the evaluation of  patients at risk for COVID-19 are in a state of rapid change based on information released by regulatory bodies including the CDC and federal and state organizations. These policies and algorithms were followed during the patient's care in the ED.  Some ED evaluations and interventions may be delayed as a result of limited staffing during and the pandemic.*   Note:  This document was prepared using Dragon voice recognition software and may include unintentional dictation errors.    Johnn Hai, PA-C 01/21/21 8299    Duffy Bruce, MD 01/26/21 2056

## 2021-01-20 NOTE — ED Triage Notes (Signed)
Pt comes into the ED via POV c/o vaginal bleeding and passing blood clots.  Pt states that the bleeding started on Monday where she was soaking through a large pad every 30 minutes.  Pt states it slowed down until today when she started passing the large blood clots.  Pt has h/o fibroids.  Pt also has h/o anemia.  Pt does admit to feeling dizzy but denies any SHOB.

## 2021-01-21 ENCOUNTER — Ambulatory Visit (INDEPENDENT_AMBULATORY_CARE_PROVIDER_SITE_OTHER): Payer: 59 | Admitting: Obstetrics and Gynecology

## 2021-01-21 ENCOUNTER — Encounter: Payer: Self-pay | Admitting: Obstetrics and Gynecology

## 2021-01-21 VITALS — Wt 206.0 lb

## 2021-01-21 DIAGNOSIS — T8332XA Displacement of intrauterine contraceptive device, initial encounter: Secondary | ICD-10-CM | POA: Diagnosis not present

## 2021-01-21 MED ORDER — MEDROXYPROGESTERONE ACETATE 10 MG PO TABS: 10.0000 mg | ORAL_TABLET | Freq: Every day | ORAL | 0 refills | Status: DC

## 2021-01-21 NOTE — Telephone Encounter (Signed)
Patient is scheduled for 01/21/21 at 11:20

## 2021-01-21 NOTE — Progress Notes (Signed)
Obstetrics & Gynecology Office Visit   Chief Complaint:  Chief Complaint  Patient presents with  . Follow-up    IUD check - heavy bleeding. Procedure RM    History of Present Illness: 50 y.o. patient presenting for follow up of Mirena IUD placement 6 months ago.  The indication for her IUD was cycle control.  Initial string checks IUD string were visualized flush with the cervix.  She was doing well with her IUD with no bleeding noted.  Then on 02/18/2021 the patient reported heavy bleeding and passage of clots.  Was seen in ED yesterday no imaging done at that time.  Bleeding continues but has decreased from initial bleeding episode 01/18/2021.   Review of Systems: Review of Systems  Constitutional: Negative.   Gastrointestinal: Negative.   Genitourinary: Negative.     Past Medical History:  Past Medical History:  Diagnosis Date  . Anxiety   . History of kidney stones   . Pre-diabetes     Past Surgical History:  Past Surgical History:  Procedure Laterality Date  . HYSTEROSCOPY WITH D & C N/A 07/23/2020   Procedure: DILATATION AND CURETTAGE /HYSTEROSCOPY/ with myosure;  Surgeon: Malachy Mood, MD;  Location: [facility] ORS;  Service: Gynecology;  Laterality: N/A;  . LITHOTRIPSY  2011  . TUBAL LIGATION  1999    Gynecologic History: No LMP recorded. (Menstrual status: IUD).  Obstetric History: G3P3  Family History:  Family History  Problem Relation Age of Onset  . Diabetes Mother   . Breast cancer Mother 83  . Diabetes Father     Social History:  Social History   Socioeconomic History  . Marital status: Married    Spouse name: Not on file  . Number of children: Not on file  . Years of education: Not on file  . Highest education level: Not on file  Occupational History  . Not on file  Tobacco Use  . Smoking status: Never Smoker  . Smokeless tobacco: Never Used  Vaping Use  . Vaping Use: Never used  Substance and Sexual Activity  . Alcohol use: No  .  Drug use: No  . Sexual activity: Yes    Birth control/protection: I.U.D.  Other Topics Concern  . Not on file  Social History Narrative  . Not on file   Social Determinants of Health   Financial Resource Strain: Not on file  Food Insecurity: Not on file  Transportation Needs: Not on file  Physical Activity: Not on file  Stress: Not on file  Social Connections: Not on file  Intimate Partner Violence: Not on file    Allergies:  No Known Allergies  Medications: Prior to Admission medications   Medication Sig Start Date End Date Taking? Authorizing Provider  cephALEXin (KEFLEX) 500 MG capsule Take 500 mg by mouth 2 (two) times daily. Patient not taking: Reported on 10/15/2020 08/31/20   [provider]  ferrous sulfate 325 (65 FE) MG tablet Take by mouth.     [provider]  HYDROcodone-acetaminophen (NORCO/VICODIN) 5-325 MG tablet Take 1 tablet by mouth every 6 (six) hours as needed. Patient not taking: Reported on 09/01/2020 07/23/20   Malachy Mood, MD  ibuprofen (ADVIL,MOTRIN) 600 MG tablet Take 1 tablet (600 mg total) by mouth every 6 (six) hours as needed for moderate pain. 03/23/15   Joanne Gavel, MD  ipratropium (ATROVENT) 0.06 % nasal spray Place into both nostrils.  Patient not taking: Reported on 09/01/2020 04/24/20   [provider]  medroxyPROGESTERone (PROVERA) 10 MG tablet Take 2 tablets (20 mg total) by mouth daily. Patient not taking: Reported on 10/15/2020 08/22/20   Malachy Mood, MD  Multiple Vitamin (MULTI-VITAMINS) TABS Take by mouth.     [provider]    Physical Exam Weight 206 lb (93.4 kg). No LMP recorded. (Menstrual status: IUD).  General: NAD HEENT: normocephalic, anicteric Pulmonary: No increased work of breathing Genitourinary:  External: Normal external female genitalia.  Normal urethral meatus, normal  Bartholin's and Skene's glands.    Vagina: Normal vaginal mucosa, no evidence of prolapse.     Cervix: Moderate bleeding, cervix dilated approximately 1cm.  No IUD string visualized.  Uterus: Non-enlarged, mobile, normal contour.  No CMT  Adnexa: ovaries non-enlarged, no adnexal masses  Rectal: deferred  Lymphatic: no evidence of inguinal lymphadenopathy Extremities: no edema, erythema, or tenderness Neurologic: Grossly intact Psychiatric: mood appropriate, affect full  Female chaperone present for pelvic and breast  portions of the physical exam  Bedside ultrasound no evidence of IUD noted at the fundus, no clear IUD noted in the cervix.  Although some increased echogenicity noted at the internal cervical os.     IUD Insertion Procedure Note Patient identified, informed consent performed, consent signed.   Discussed risks of irregular bleeding, cramping, infection, malpositioning, expulsion or uterine perforation of the IUD (1:1000 placements)  which may require further procedure such as laparoscopy.  IUD while effective at preventing pregnancy do not prevent transmission of sexually transmitted diseases and use of barrier methods for this purpose was discussed. Time out was performed.  Urine pregnancy test negative.  Speculum placed in the vagina.  Cervix visualized.  Cleaned with Betadine x 2.  Grasped anteriorly with a single tooth tenaculum.  Uterus sounded to 8cm. IUD placed per manufacturer's recommendations.  Strings trimmed to 3 cm. Tenaculum was removed, good hemostasis noted.  Patient tolerated procedure well.   Repeat bedside ultrasound to confirm fundal placement following trimming of the string revealed the IUD to be in the mid uterus and not fundal.  Approximately 4cm from the uterine fundus.  The ultrasound measurement from the fundal endometrium to the cervix on ultrasound was 7.89cm consistent with sounding length.  IUD removed.  Assessment: 50 y.o. G3P3 follow up AUB-I, IUD expulsion Plan: Problem List Items Addressed This Visit   None   Visit Diagnoses    IUD  migration, initial encounter    -  Primary       1.  No IUD visualized today on TVUS.  Attempt at replacement unsuccesful given cervical dilation and active bleeding.  Discussed course of provera to stop bleeding episode and attempt at replacement in a week.  Will order KUB to confirm expulsion.    2) A total of 15 minutes were spent in face-to-face contact with the patient during this encounter with over half of that time devoted to counseling and coordination of care.  3) Return in about 1 week (around 01/28/2021) for Mirena IUD palcement.   Malachy Mood, MD, [facility], [facility] 01/21/2021, 12:05 PM

## 2021-01-22 ENCOUNTER — Ambulatory Visit
Admission: RE | Admit: 2021-01-22 | Discharge: 2021-01-22 | Disposition: A | Discharge status: Home / Self Care | Payer: 59 | Attending: Obstetrics and Gynecology | Admitting: Obstetrics and Gynecology

## 2021-01-22 ENCOUNTER — Telehealth: Payer: Self-pay

## 2021-01-22 ENCOUNTER — Ambulatory Visit
Admission: RE | Admit: 2021-01-22 | Discharge: 2021-01-22 | Disposition: A | Discharge status: Home / Self Care | Payer: 59 | Source: Ambulatory Visit | Attending: Obstetrics and Gynecology | Admitting: Obstetrics and Gynecology

## 2021-01-22 ENCOUNTER — Other Ambulatory Visit: Payer: Self-pay

## 2021-01-22 DIAGNOSIS — T8332XA Displacement of intrauterine contraceptive device, initial encounter: Secondary | ICD-10-CM | POA: Diagnosis present

## 2021-01-22 NOTE — Telephone Encounter (Signed)
Called patient to advise that she can go to [facility] imaging for her x-ray AMS ordered. They have access to her order. She mentioned going this afternoon.

## 2021-01-27 ENCOUNTER — Encounter: Payer: Self-pay | Admitting: Obstetrics and Gynecology

## 2021-01-27 ENCOUNTER — Other Ambulatory Visit: Payer: Self-pay

## 2021-01-27 ENCOUNTER — Ambulatory Visit (INDEPENDENT_AMBULATORY_CARE_PROVIDER_SITE_OTHER): Payer: 59 | Admitting: Obstetrics and Gynecology

## 2021-01-27 VITALS — BP 146/92 | Ht 67.0 in | Wt 208.0 lb

## 2021-01-27 DIAGNOSIS — N939 Abnormal uterine and vaginal bleeding, unspecified: Secondary | ICD-10-CM | POA: Diagnosis not present

## 2021-01-27 NOTE — Progress Notes (Signed)
   GYNECOLOGY OFFICE PROCEDURE NOTE  Heidi Allen is a 50 y.o. G3P3 here for a Mirena IUD insertion. No GYN concerns.  Last pap smear was on 06/13/2019 and was normal.  The patient is currently using tubal ligationfor contraception  The indication for her IUD is cycle control.  Patient underwent Mirena IUD insertion 09/01/2020.  She did well initially until she had heavy bleeding with passage of clots earlier this month. She was seen 01/21/2021 with no IUD strings visualized and no IUD noted on bedside ultrasound.  Attempt at repeat placement was unsuccessful as post placement ultrasound revealed the IUD to be low in the uterus.  The patient was still bleeding so decision was made to allow bleeding to subside and re-attempt placement.  Abdominal ultrasound confirmed absence of IUD without migration outside of the uterus.  She presents today and reports no further bleeding.    IUD Insertion Procedure Note Patient identified, informed consent performed, consent signed.   Discussed risks of irregular bleeding, cramping, infection, malpositioning, expulsion or uterine perforation of the IUD (1:1000 placements)  which may require further procedure such as laparoscopy.  IUD while effective at preventing pregnancy do not prevent transmission of sexually transmitted diseases and use of barrier methods for this purpose was discussed. Time out was performed.  Urine pregnancy test negative.  Speculum placed in the vagina.  Cervix visualized.  Cleaned with Betadine x 2.  Grasped anteriorly with a single tooth tenaculum.  Uterus unable to be sounded past 4cm.  Prior sounding lengths was 4cm.  The patient underwent hysteroscopic myomectomy 07/23/2020 and the indication for her IUD was cycle control.  We discussed option to proceed including Cytotec pre placement vs waiting for next cycle and trying at that time.  In addition we discussed expectant management as cycles may have improved post myomectomy.

## 2021-03-15 ENCOUNTER — Other Ambulatory Visit: Payer: Self-pay | Admitting: Obstetrics and Gynecology

## 2021-03-15 MED ORDER — MEDROXYPROGESTERONE ACETATE 10 MG PO TABS: ORAL_TABLET | ORAL | 0 refills | Status: DC

## 2021-03-24 ENCOUNTER — Ambulatory Visit (INDEPENDENT_AMBULATORY_CARE_PROVIDER_SITE_OTHER): Payer: 59 | Admitting: Obstetrics and Gynecology

## 2021-03-24 ENCOUNTER — Other Ambulatory Visit: Payer: Self-pay

## 2021-03-24 VITALS — BP 158/78 | HR 92 | Ht 67.0 in | Wt 209.0 lb

## 2021-03-24 DIAGNOSIS — Z30430 Encounter for insertion of intrauterine contraceptive device: Secondary | ICD-10-CM | POA: Diagnosis not present

## 2021-03-24 NOTE — Progress Notes (Signed)
Bleeding has stopped.

## 2021-03-24 NOTE — Progress Notes (Signed)
   GYNECOLOGY OFFICE PROCEDURE NOTE  Heidi Allen is a 50 y.o. G3P3 here for a MIrena IUD insertion. No GYN concerns.   The indication for her IUD is cycle control.  IUD Insertion Procedure Note Patient identified, informed consent performed, consent signed.   Discussed risks of irregular bleeding, cramping, infection, malpositioning, expulsion or uterine perforation of the IUD (1:1000 placements)  which may require further procedure such as laparoscopy.  IUD while effective at preventing pregnancy do not prevent transmission of sexually transmitted diseases and use of barrier methods for this purpose was discussed. Time out was performed.  Urine pregnancy test negative.  Speculum placed in the vagina.  Cervix visualized.  Cleaned with Betadine x 2.  Grasped anteriorly with a single tooth tenaculum.   The uterus was unable to be sounded secondary to anteversion of the uterus.  Dilators were used.  The single tooth tenaculum was switch to the posterior cervix.  This allowed the uterus to be successfully sounded to 7 cm. IUD placed per manufacturer's recommendations.  Strings trimmed to 3 cm. Tenaculum was removed, good hemostasis noted.  Patient tolerated procedure well.   Patient was given post-procedure instructions.  She was advised to have backup contraception for one week.  Patient was also asked to check IUD strings periodically and follow up in 6 weeks for IUD check.   Malachy Mood, MD, [facility], [place]

## 2021-04-10 ENCOUNTER — Other Ambulatory Visit: Payer: Self-pay | Admitting: Obstetrics and Gynecology

## 2021-04-23 ENCOUNTER — Other Ambulatory Visit: Payer: Self-pay

## 2021-04-23 ENCOUNTER — Ambulatory Visit (INDEPENDENT_AMBULATORY_CARE_PROVIDER_SITE_OTHER): Payer: 59 | Admitting: Obstetrics and Gynecology

## 2021-04-23 ENCOUNTER — Encounter: Payer: Self-pay | Admitting: Obstetrics and Gynecology

## 2021-04-23 VITALS — BP 118/76 | Ht 67.0 in | Wt 204.0 lb

## 2021-04-23 DIAGNOSIS — Z30431 Encounter for routine checking of intrauterine contraceptive device: Secondary | ICD-10-CM

## 2021-04-23 NOTE — Progress Notes (Signed)
Obstetrics & Gynecology Office Visit   Chief Complaint:  Chief Complaint  Patient presents with   Follow-up    IUD string check - no concerns. RM 4    History of Present Illness: 50 y.o. patient presenting for follow up of Mirena IUD placement 4 weeks ago.  The indication for her IUD was cycle control.  She denies any complications since her IUD placement.  Still having some occasional spotting.  Has not checked for strings  Review of Systems: Review of Systems  Constitutional: Negative.   Gastrointestinal: Negative.   Genitourinary: Negative.    Past Medical History:  Past Medical History:  Diagnosis Date   Anxiety    History of kidney stones    Pre-diabetes     Past Surgical History:  Past Surgical History:  Procedure Laterality Date   HYSTEROSCOPY WITH D & C N/A 07/23/2020   Procedure: DILATATION AND CURETTAGE /HYSTEROSCOPY/ with myosure;  Surgeon: Malachy Mood, MD;  Location: [facility] ORS;  Service: Gynecology;  Laterality: N/A;   LITHOTRIPSY  2011   [place]    Gynecologic History: No LMP recorded. (Menstrual status: IUD).  Obstetric History: G3P3  Family History:  Family History  Problem Relation Age of Onset   Diabetes Mother    Breast cancer Mother 86   Diabetes Father     Social History:  Social History   Socioeconomic History   Marital status: Married    Spouse name: Not on file   Number of children: Not on file   Years of education: Not on file   Highest education level: Not on file  Occupational History   Not on file  Tobacco Use   Smoking status: Never   Smokeless tobacco: Never  Vaping Use   Vaping Use: Never used  Substance and Sexual Activity   Alcohol use: No   Drug use: No   Sexual activity: Yes    Birth control/protection: I.U.D.  Other Topics Concern   Not on file  Social History Narrative   Not on file   Social Determinants of Health   Financial Resource Strain: Not on file  Food Insecurity: Not on  file  Transportation Needs: Not on file  Physical Activity: Not on file  Stress: Not on file  Social Connections: Not on file  Intimate Partner Violence: Not on file    Allergies:  No Known Allergies  Medications: Prior to Admission medications   Medication Sig Start Date End Date Taking? Authorizing Provider  ferrous sulfate 325 (65 FE) MG tablet Take by mouth.    Yes [provider]  Multiple Vitamin (MULTI-VITAMINS) TABS Take by mouth.    Yes [provider]  ibuprofen (ADVIL,MOTRIN) 600 MG tablet Take 1 tablet (600 mg total) by mouth every 6 (six) hours as needed for moderate pain. 03/23/15   Joanne Gavel, MD  medroxyPROGESTERone (PROVERA) 10 MG tablet Take 2 tablets ('20mg'$ ) po tid x 7 days, then 2 tablets ('20mg'$ ) po once daily maintenance after the initial 7 days Patient not taking: Reported on 04/23/2021 03/15/21   Malachy Mood, MD    Physical Exam Blood pressure 118/76, height '5\' 7"'$  (1.702 m), weight 204 lb (92.5 kg). No LMP recorded. (Menstrual status: IUD).  General: NAD HEENT: normocephalic, anicteric Pulmonary: No increased work of breathing  Genitourinary:  External: Normal external female genitalia.  Normal urethral meatus, normal  Bartholin's and Skene's glands.    Vagina: Normal vaginal mucosa, no evidence of prolapse.  Cervix: Grossly normal in appearance, no bleeding, IUD strings visualized 2cm  Uterus: Non-enlarged, mobile, normal contour.  No CMT  Adnexa: ovaries non-enlarged, no adnexal masses  Rectal: deferred  Lymphatic: no evidence of inguinal lymphadenopathy Extremities: no edema, erythema, or tenderness Neurologic: Grossly intact Psychiatric: mood appropriate, affect full  Female chaperone present for pelvic and breast  portions of the physical exam  Assessment: 50 y.o. G3P3 No problem-specific Assessment & Plan notes found for this encounter.   Plan: Problem List Items Addressed This Visit   None Visit Diagnoses      IUD check up    -  Primary        1.  The patient was given instructions to check her IUD strings monthly and call with any problems or concerns.  She should call for fevers, chills, abnormal vaginal discharge, pelvic pain, or other complaints.  2.   IUDs while effective at preventing pregnancy do not prevent transmission of sexually transmitted diseases and use of barrier methods for this purpose was discussed.  Low overall incidence of failure with 99.7% efficacy rate in typical use.  The patient has not contraindication to IUD placement.  3.  She will return for a annual exam in 1 year.  All questions answered.  4) A total of 15 minutes were spent in face-to-face contact with the patient during this encounter with over half of that time devoted to counseling and coordination of care.  5) Return in about 1 year (around 04/23/2022) for annual.   Malachy Mood, MD, [place], [place] 04/23/2021, 8:35 AM

## 2021-05-28 ENCOUNTER — Other Ambulatory Visit: Payer: Self-pay | Admitting: Obstetrics and Gynecology

## 2021-05-28 MED ORDER — NITROFURANTOIN MONOHYD MACRO 100 MG PO CAPS: 100.0000 mg | ORAL_CAPSULE | Freq: Two times a day (BID) | ORAL | 0 refills | Status: AC

## 2021-06-22 NOTE — Telephone Encounter (Signed)
If we can have her come in Thursday I can take a look

## 2021-06-23 NOTE — Telephone Encounter (Signed)
Patient is scheduled for 10:30 10/20 with AMS

## 2021-06-24 ENCOUNTER — Ambulatory Visit (INDEPENDENT_AMBULATORY_CARE_PROVIDER_SITE_OTHER): Payer: 59 | Admitting: Obstetrics and Gynecology

## 2021-06-24 ENCOUNTER — Encounter: Payer: Self-pay | Admitting: Obstetrics and Gynecology

## 2021-06-24 ENCOUNTER — Other Ambulatory Visit: Payer: Self-pay

## 2021-06-24 VITALS — BP 156/92 | Ht 67.0 in | Wt 207.0 lb

## 2021-06-24 DIAGNOSIS — T8339XA Other mechanical complication of intrauterine contraceptive device, initial encounter: Secondary | ICD-10-CM

## 2021-06-24 DIAGNOSIS — N939 Abnormal uterine and vaginal bleeding, unspecified: Secondary | ICD-10-CM

## 2021-06-24 MED ORDER — MEDROXYPROGESTERONE ACETATE 10 MG PO TABS: 10.0000 mg | ORAL_TABLET | Freq: Every day | ORAL | 3 refills | Status: DC

## 2021-06-24 NOTE — Progress Notes (Signed)
Obstetrics & Gynecology Office Visit   Chief Complaint:  Chief Complaint  Patient presents with   Follow-up    ? IUD falling out, heavy bleeding with clots. [place] 1    History of Present Illness: 50 y.o. patient presenting for follow up of Mirena IUD placement  8 week  ago.  The indication for her IUD was cycle control.  She admits to any complications since her IUD placement.  Developed heavy bleeding again this past week with expulsion passage of large clots. No significant cramping with clots.    Review of Systems: Review of Systems  Constitutional: Negative.   Gastrointestinal: Negative.   Genitourinary: Negative.    Past Medical History:  Past Medical History:  Diagnosis Date   Anxiety    History of kidney stones    Pre-diabetes     Past Surgical History:  Past Surgical History:  Procedure Laterality Date   HYSTEROSCOPY WITH D & C N/A 07/23/2020   Procedure: DILATATION AND CURETTAGE /HYSTEROSCOPY/ with myosure;  Surgeon: Malachy Mood, MD;  Location: [facility] ORS;  Service: Gynecology;  Laterality: N/A;   LITHOTRIPSY  2011   [place]    Gynecologic History: No LMP recorded. (Menstrual status: IUD).  Obstetric History: G3P3  Family History:  Family History  Problem Relation Age of Onset   Diabetes Mother    Breast cancer Mother 29   Diabetes Father     Social History:  Social History   Socioeconomic History   Marital status: Married    Spouse name: Not on file   Number of children: Not on file   Years of education: Not on file   Highest education level: Not on file  Occupational History   Not on file  Tobacco Use   Smoking status: Never   Smokeless tobacco: Never  Vaping Use   Vaping Use: Never used  Substance and Sexual Activity   Alcohol use: No   Drug use: No   Sexual activity: Yes    Birth control/protection: I.U.D.  Other Topics Concern   Not on file  Social History Narrative   Not on file   Social Determinants of Health    Financial Resource Strain: Not on file  Food Insecurity: Not on file  Transportation Needs: Not on file  Physical Activity: Not on file  Stress: Not on file  Social Connections: Not on file  Intimate Partner Violence: Not on file    Allergies:  No Known Allergies  Medications: Prior to Admission medications   Medication Sig Start Date End Date Taking? Authorizing Provider  ferrous sulfate 325 (65 FE) MG tablet Take by mouth.    Yes [provider]  Multiple Vitamin (MULTI-VITAMINS) TABS Take by mouth.    Yes [provider]  medroxyPROGESTERone (PROVERA) 10 MG tablet Take 2 tablets (20mg ) po tid x 7 days, then 2 tablets (20mg ) po once daily maintenance after the initial 7 days Patient not taking: Reported on 04/23/2021 03/15/21   Malachy Mood, MD    Physical Exam Blood pressure (!) 156/92, height 5\' 7"  (1.702 m), weight 207 lb (93.9 kg). No LMP recorded. (Menstrual status: IUD).  General: NAD HEENT: normocephalic, anicteric Pulmonary: No increased work of breathing  Genitourinary:  External: Normal external female genitalia.  Normal urethral meatus, normal  Bartholin's and Skene's glands.    Vagina: Normal vaginal mucosa, no evidence of prolapse.    Cervix: Grossly normal in appearance, clot noted at the cervical os/vagina and removed using  ring forceps.  No IUD strings visualized, moderate bleeding.  Uterus: Non-enlarged, mobile, normal contour.  No CMT  Adnexa: ovaries non-enlarged, no adnexal masses  Rectal: deferred  Lymphatic: no evidence of inguinal lymphadenopathy Extremities: no edema, erythema, or tenderness Neurologic: Grossly intact Psychiatric: mood appropriate, affect full  Female chaperone present for pelvic and breast  portions of the physical exam  TVUS reveals normal appearing uterus. Endometrial stripe thin 6.88mm. No evidence of IUD seen.  Assessment: 50 y.o. G3P3  IUD expulsion (2nd time)   Plan: Problem List Items Addressed  This Visit       Genitourinary   Abnormal uterine bleeding (AUB) - Primary   Relevant Orders   CBC     1.  Ultrasound confirmed absence of IUD - this is the second IUD expulsion by the patient.  Will start provera taper.  CBC today.  She has responded well to provera in the past, we discussed continuing on some sort of po progestin vs endometrial ablation or hysterectomy.  At present patient opts for repeat trial of provera.  2.  She will return for a annual exam in 1 year.  All questions answered.  3) A total of 15 minutes were spent in face-to-face contact with the patient during this encounter with over half of that time devoted to counseling and coordination of care.  5) Return in about 8 months (around 02/22/2022) for annual.   Malachy Mood, MD, [place], [place] 06/24/2021, 11:11 AM

## 2021-06-25 LAB — CBC
Hematocrit: 33.3 % — ABNORMAL LOW (ref 34.0–46.6)
Hemoglobin: 10.9 g/dL — ABNORMAL LOW (ref 11.1–15.9)
MCH: 26.9 pg (ref 26.6–33.0)
MCHC: 32.7 g/dL (ref 31.5–35.7)
MCV: 82 fL (ref 79–97)
Platelets: 405 10*3/uL (ref 150–450)
RBC: 4.05 x10E6/uL (ref 3.77–5.28)
RDW: 15.1 % (ref 11.7–15.4)
WBC: 14.1 10*3/uL — ABNORMAL HIGH (ref 3.4–10.8)

## 2021-07-01 NOTE — Telephone Encounter (Signed)
Mirena rcvd/charged 09/01/20

## 2021-11-22 ENCOUNTER — Telehealth: Payer: Self-pay

## 2021-11-22 NOTE — Telephone Encounter (Signed)
Pt calling; is on progesterone 2 po qd for maintenance for severe bleeding; rx says to take one qd; MyChart says 2 po qd.  [phone]  Called [facility] [place]; spoke c Mitzi Hansen to changed it and will have it ready for pt this pm; pt aware. ?

## 2022-01-06 ENCOUNTER — Other Ambulatory Visit: Payer: Self-pay | Admitting: Obstetrics and Gynecology

## 2022-01-06 ENCOUNTER — Telehealth: Payer: Self-pay

## 2022-01-06 MED ORDER — MEDROXYPROGESTERONE ACETATE 10 MG PO TABS
ORAL_TABLET | ORAL | 0 refills | Status: DC
Start: 2022-01-06 — End: 2023-11-07

## 2022-01-06 NOTE — Telephone Encounter (Signed)
Spoke with Diane, She is aware that Dr. Georgianne Fick is no longer with [place]. She hasn't decided which provider she will see when she needs to return in October, did state that she will let us know which provider she will go with soon. But needs a refill for her Progesterone.  ?

## 2022-01-06 NOTE — Telephone Encounter (Signed)
Rx RF eRxd to 2 tabs provera daily. Annual due 6/23

## 2022-01-06 NOTE — Telephone Encounter (Signed)
Left a voicemail for pt to return my call. ?

## 2022-01-06 NOTE — Telephone Encounter (Signed)
Patient called in asking for a refill of her progesterone. She stated her pharmacy told her something was wrong with the prescription. ?

## 2022-01-06 NOTE — Telephone Encounter (Signed)
Called patient to let her know her refill has been sent in to her pharmacy and she was due for her annual in 02/2022. ?

## 2022-01-06 NOTE — Progress Notes (Signed)
Rx RF provera to take daily for bleeding. Annual due 6/23 ?

## 2022-03-12 IMAGING — MG MM DIGITAL SCREENING BILAT W/ TOMO AND CAD
8 series · 8 of 24 positions shown · non-contrast
Comparison: Previous exam(s).

CLINICAL DATA: Screening.

EXAM:
DIGITAL SCREENING BILATERAL MAMMOGRAM WITH TOMOSYNTHESIS AND CAD
TECHNIQUE: Bilateral screening digital craniocaudal and mediolateral oblique
mammograms were obtained. Bilateral screening digital breast
tomosynthesis was performed. The images were evaluated with
computer-aided detection.

[L MLO synth-2D]
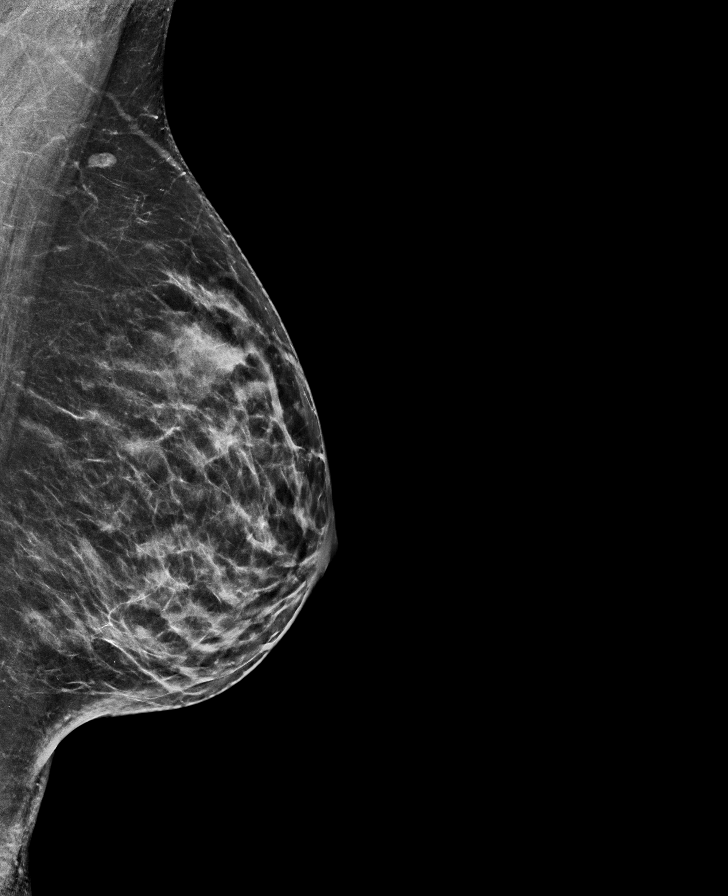

[R MLO synth-2D]
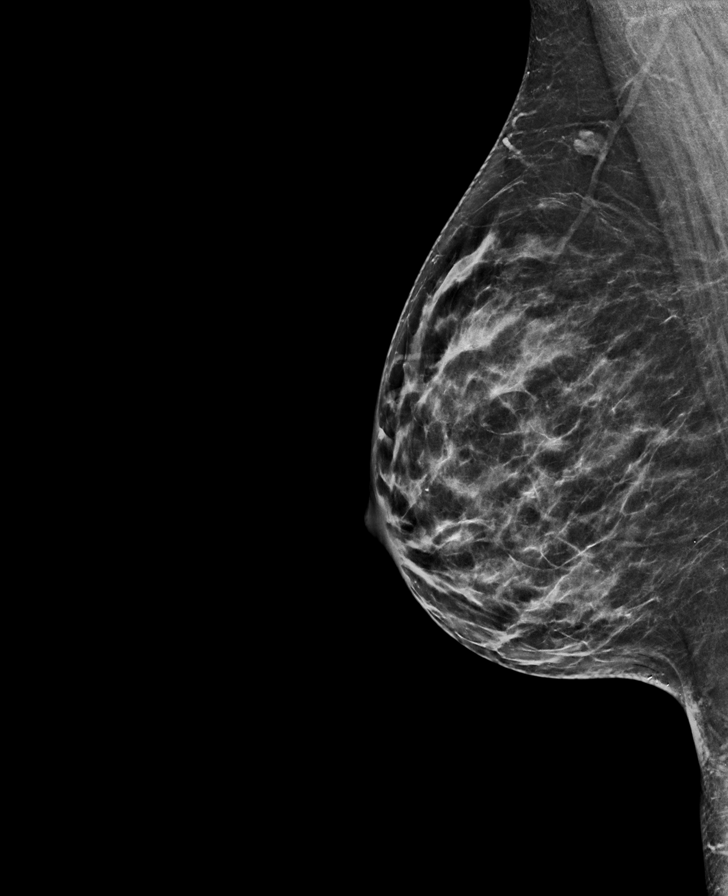

[L CC synth-2D]
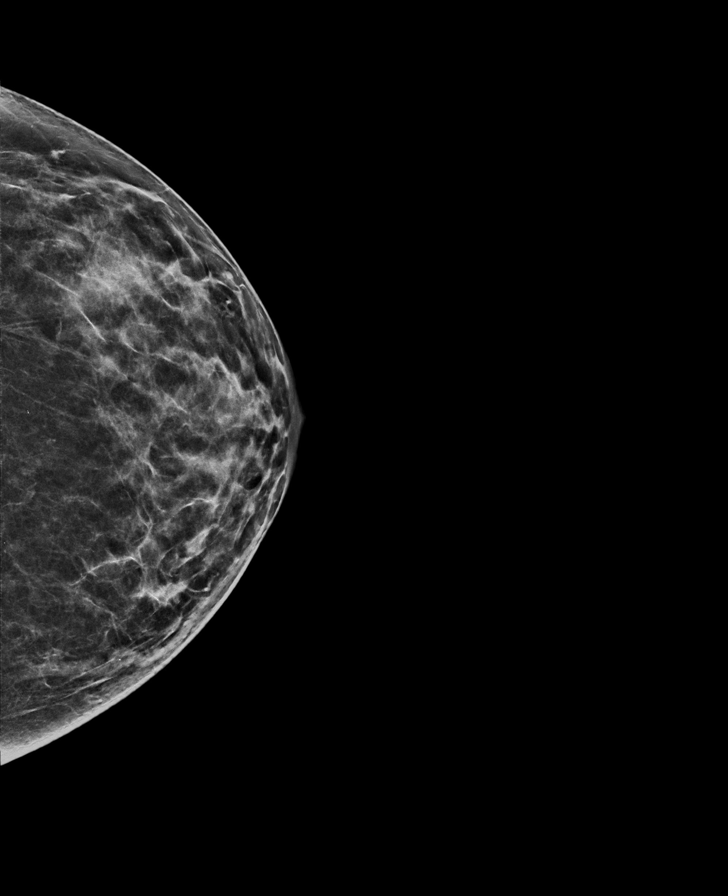

[R CC synth-2D]
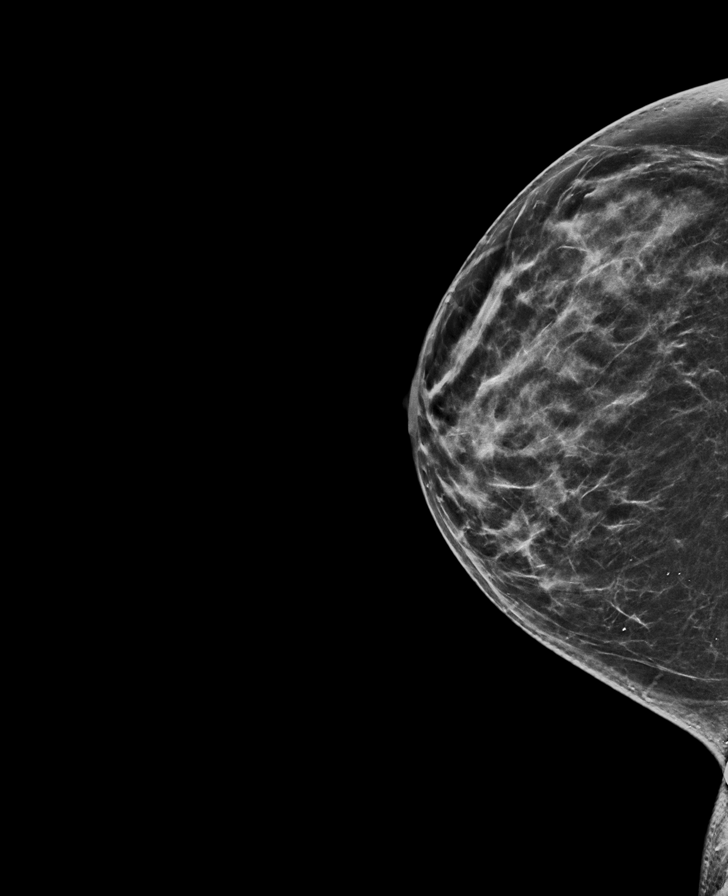

[L CC tomo · tomo slice 31/62.0]
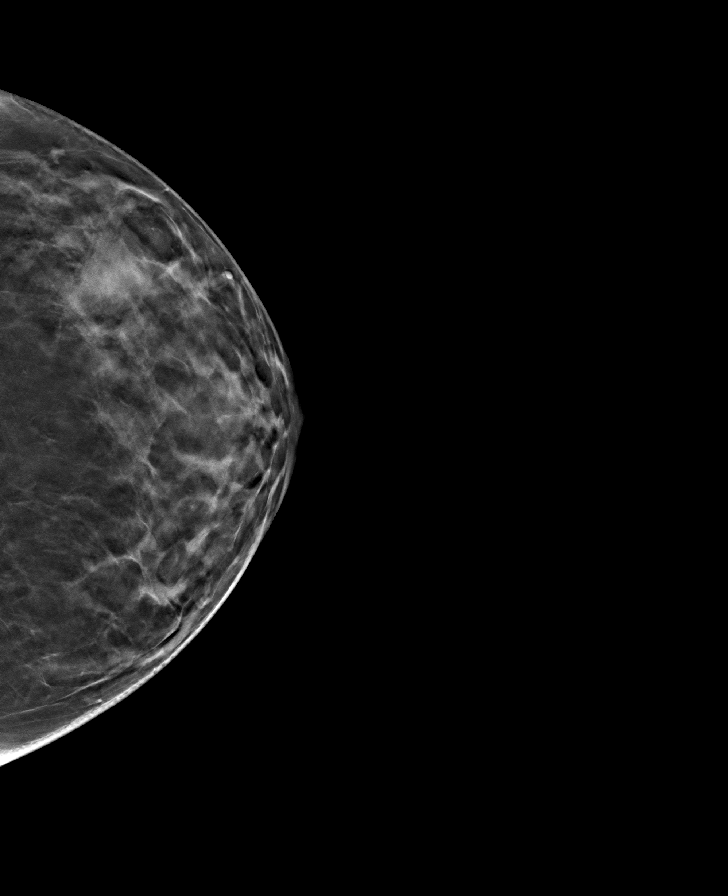

[R MLO tomo · tomo slice 36/71.0]
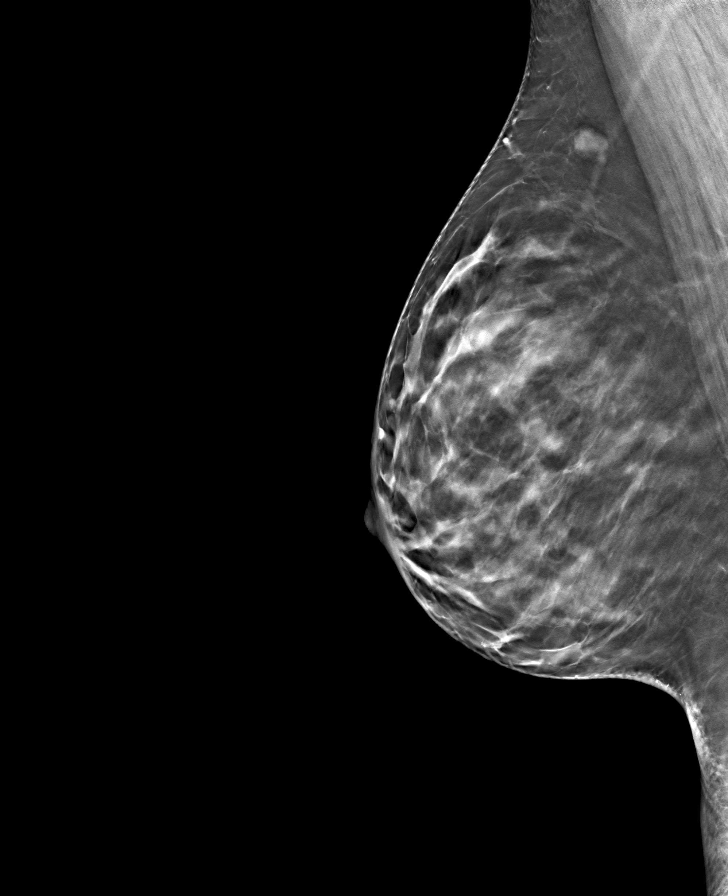

[L MLO tomo · tomo slice 35/69.0]
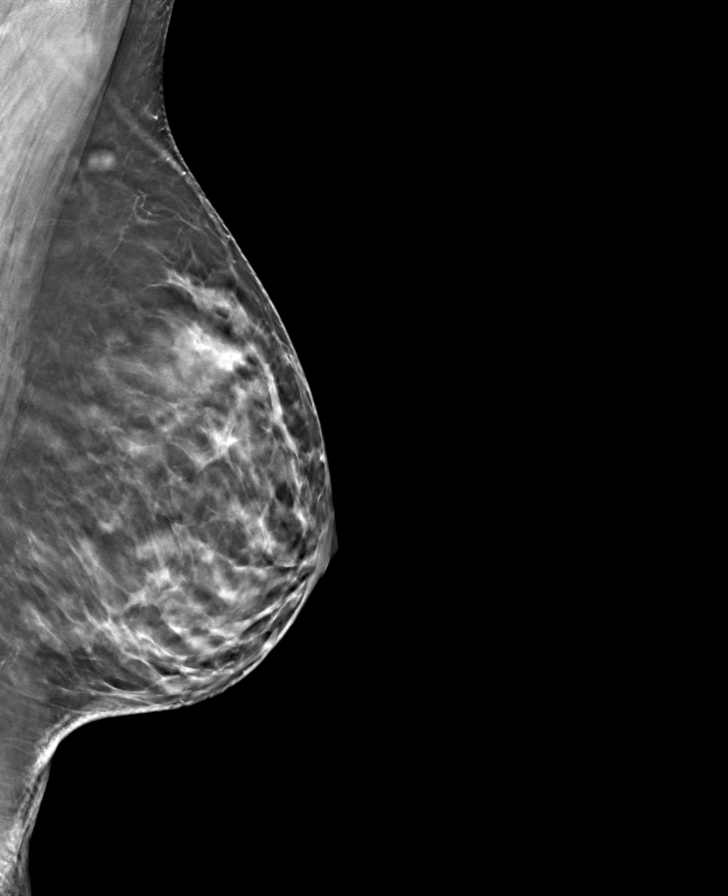

[R CC tomo · tomo slice 33/66.0]
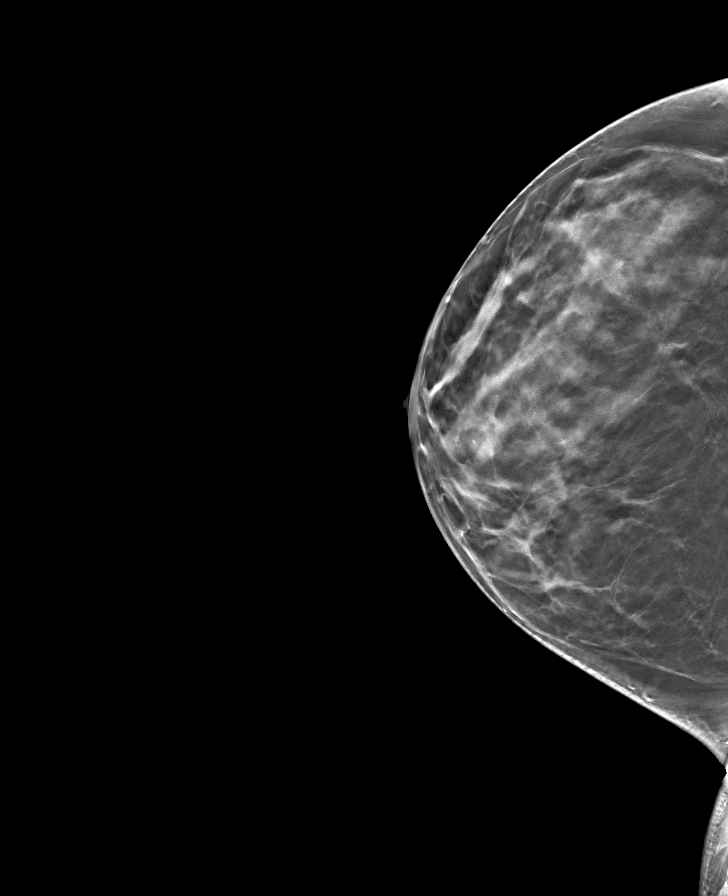

[8 of 24 positions shown; findings below may reference images not displayed]

ACR Breast Density Category c: The breast tissue is heterogeneously
dense, which may obscure small masses.
FINDINGS: There are no findings suspicious for malignancy. The images were
evaluated with computer-aided detection.
IMPRESSION: No mammographic evidence of malignancy. A result letter of this
screening mammogram will be mailed directly to the patient.

RECOMMENDATION:
Screening mammogram in one year. (Code:T4-5-GWO)

BI-RADS CATEGORY  1: Negative.

## 2022-03-23 IMAGING — CR DG ABDOMEN 1V
2 series · 2 of 2 positions shown · non-contrast
Comparison: None.

CLINICAL DATA: Suspected IUD expulsion.

EXAM:
ABDOMEN - 1 VIEW

[abdomen kub (1 of 2)]
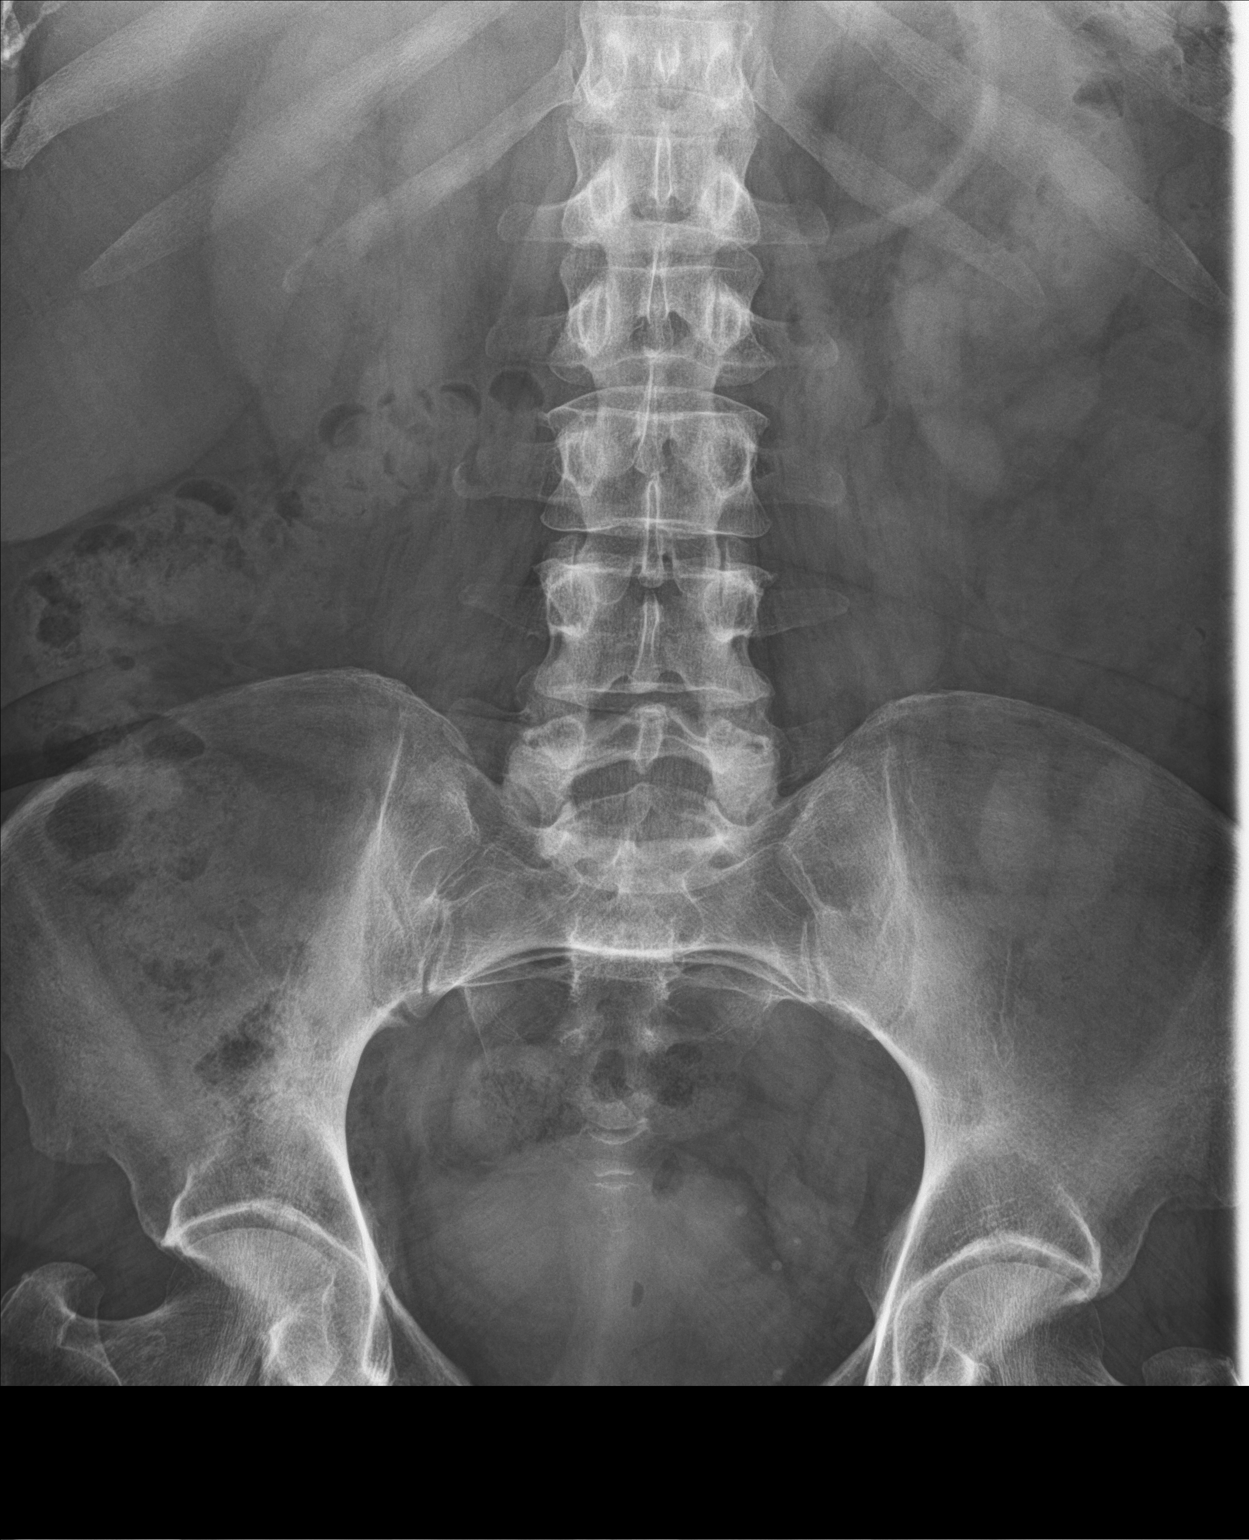

[abdomen kub (2 of 2)]
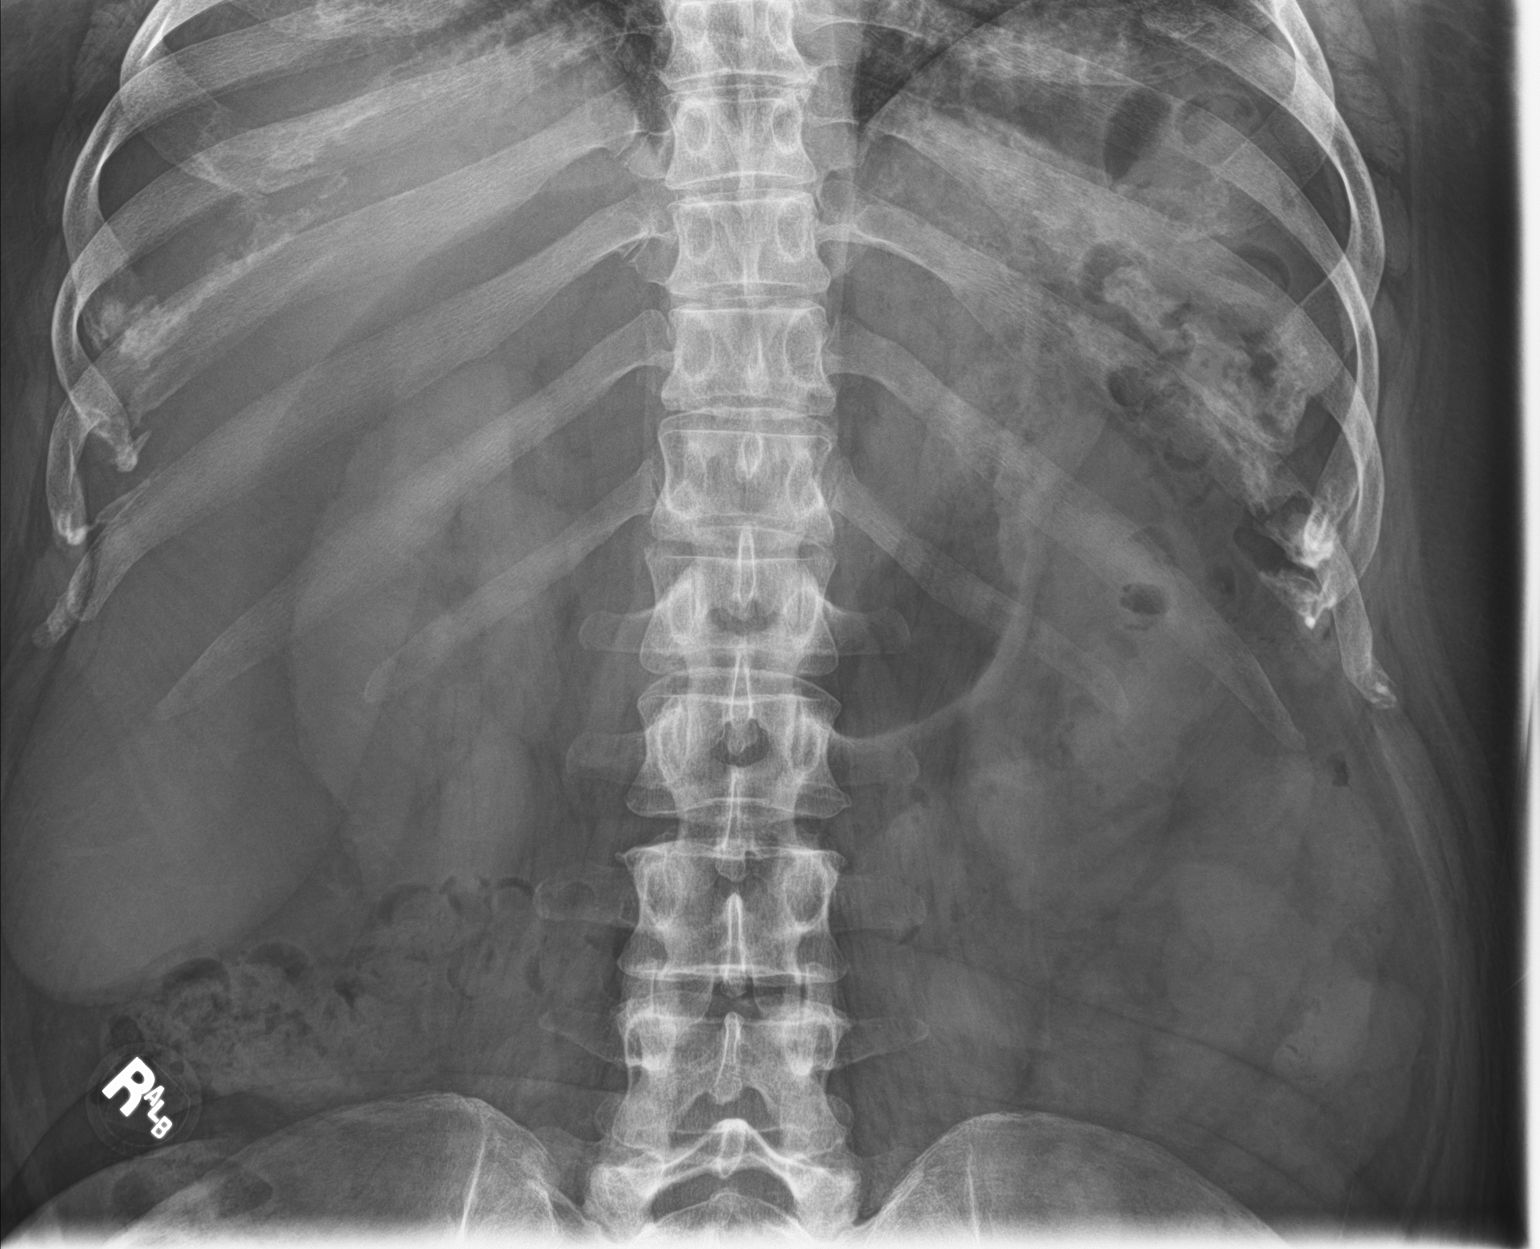

[2 of 2 positions shown; findings below may reference images not displayed]

FINDINGS: There is no metallic intrauterine contraceptive device on either
image.
IMPRESSION: No metallic intrauterine contraceptive device visualized.

## 2022-04-02 ENCOUNTER — Other Ambulatory Visit: Payer: Self-pay | Admitting: Obstetrics and Gynecology

## 2022-06-13 DIAGNOSIS — L509 Urticaria, unspecified: Secondary | ICD-10-CM | POA: Diagnosis not present

## 2022-06-13 DIAGNOSIS — R220 Localized swelling, mass and lump, head: Secondary | ICD-10-CM | POA: Diagnosis not present

## 2022-06-22 DIAGNOSIS — L508 Other urticaria: Secondary | ICD-10-CM | POA: Diagnosis not present

## 2022-08-06 DIAGNOSIS — N390 Urinary tract infection, site not specified: Secondary | ICD-10-CM | POA: Diagnosis not present

## 2022-12-23 DIAGNOSIS — Z1331 Encounter for screening for depression: Secondary | ICD-10-CM | POA: Diagnosis not present

## 2022-12-23 DIAGNOSIS — E1165 Type 2 diabetes mellitus with hyperglycemia: Secondary | ICD-10-CM | POA: Diagnosis not present

## 2022-12-23 DIAGNOSIS — Z0000 Encounter for general adult medical examination without abnormal findings: Secondary | ICD-10-CM | POA: Diagnosis not present

## 2022-12-23 DIAGNOSIS — L509 Urticaria, unspecified: Secondary | ICD-10-CM | POA: Diagnosis not present

## 2022-12-23 DIAGNOSIS — N951 Menopausal and female climacteric states: Secondary | ICD-10-CM | POA: Diagnosis not present

## 2022-12-23 DIAGNOSIS — E781 Pure hyperglyceridemia: Secondary | ICD-10-CM | POA: Diagnosis not present

## 2023-03-16 DIAGNOSIS — N939 Abnormal uterine and vaginal bleeding, unspecified: Secondary | ICD-10-CM | POA: Diagnosis not present

## 2023-04-10 DIAGNOSIS — N760 Acute vaginitis: Secondary | ICD-10-CM | POA: Diagnosis not present

## 2023-05-16 DIAGNOSIS — J3489 Other specified disorders of nose and nasal sinuses: Secondary | ICD-10-CM | POA: Diagnosis not present

## 2023-07-10 DIAGNOSIS — H6501 Acute serous otitis media, right ear: Secondary | ICD-10-CM | POA: Diagnosis not present

## 2023-07-10 DIAGNOSIS — H0011 Chalazion right upper eyelid: Secondary | ICD-10-CM | POA: Diagnosis not present

## 2023-07-12 DIAGNOSIS — H00011 Hordeolum externum right upper eyelid: Secondary | ICD-10-CM | POA: Diagnosis not present

## 2023-08-09 DIAGNOSIS — K644 Residual hemorrhoidal skin tags: Secondary | ICD-10-CM | POA: Diagnosis not present

## 2023-08-09 DIAGNOSIS — K625 Hemorrhage of anus and rectum: Secondary | ICD-10-CM | POA: Diagnosis not present

## 2023-08-09 DIAGNOSIS — Z800 Family history of malignant neoplasm of digestive organs: Secondary | ICD-10-CM | POA: Diagnosis not present

## 2023-11-01 DIAGNOSIS — J019 Acute sinusitis, unspecified: Secondary | ICD-10-CM | POA: Diagnosis not present

## 2023-11-01 DIAGNOSIS — R051 Acute cough: Secondary | ICD-10-CM | POA: Diagnosis not present

## 2023-11-07 ENCOUNTER — Ambulatory Visit (INDEPENDENT_AMBULATORY_CARE_PROVIDER_SITE_OTHER): Payer: 59 | Admitting: Family Medicine

## 2023-11-07 ENCOUNTER — Encounter: Payer: Self-pay | Admitting: Family Medicine

## 2023-11-07 VITALS — BP 128/85 | HR 92 | Temp 98.3°F | Resp 18 | Ht 67.0 in | Wt 203.0 lb

## 2023-11-07 DIAGNOSIS — Z23 Encounter for immunization: Secondary | ICD-10-CM

## 2023-11-07 DIAGNOSIS — E118 Type 2 diabetes mellitus with unspecified complications: Secondary | ICD-10-CM | POA: Diagnosis not present

## 2023-11-07 DIAGNOSIS — R7309 Other abnormal glucose: Secondary | ICD-10-CM | POA: Diagnosis not present

## 2023-11-07 DIAGNOSIS — K219 Gastro-esophageal reflux disease without esophagitis: Secondary | ICD-10-CM | POA: Insufficient documentation

## 2023-11-07 DIAGNOSIS — Z1211 Encounter for screening for malignant neoplasm of colon: Secondary | ICD-10-CM

## 2023-11-07 DIAGNOSIS — Z1212 Encounter for screening for malignant neoplasm of rectum: Secondary | ICD-10-CM

## 2023-11-07 DIAGNOSIS — N939 Abnormal uterine and vaginal bleeding, unspecified: Secondary | ICD-10-CM | POA: Diagnosis not present

## 2023-11-07 DIAGNOSIS — Z0000 Encounter for general adult medical examination without abnormal findings: Secondary | ICD-10-CM | POA: Insufficient documentation

## 2023-11-07 LAB — POCT GLYCOSYLATED HEMOGLOBIN (HGB A1C): HbA1c POC (<> result, manual entry): 8.4 % (ref 4.0–5.6)

## 2023-11-07 MED ORDER — METFORMIN HCL 500 MG PO TABS: 500.0000 mg | ORAL_TABLET | ORAL | 1 refills | Status: DC

## 2023-11-07 MED ORDER — FAMOTIDINE 20 MG PO TABS: 20.0000 mg | ORAL_TABLET | Freq: Two times a day (BID) | ORAL | 1 refills | Status: AC

## 2023-11-07 NOTE — Assessment & Plan Note (Signed)
Famotidine 20 mg twice daily.

## 2023-11-07 NOTE — Assessment & Plan Note (Signed)
 A1c 8.4% today.  Please begin metformin 500 mg with 1 meal a day preferably her evening meal.  Please see the eye doctor and get a retinal check.  Please wear shoes in the house and check your feet daily.  Discussed GLP-1 medications.  She will consider this.

## 2023-11-07 NOTE — Progress Notes (Signed)
 New Patient Office Visit  Subjective    Patient ID: Heidi Allen, female    DOB: Apr 14, 1971  Age: 53 y.o. MRN: [id number]  CC:  Chief Complaint  Patient presents with   Medical Management of Chronic Issues    Not fasting   Anemia   Gastroesophageal Reflux    Would like colonoscopy    HPI Heidi Allen presents to establish care Delightful 53 year old with a history of DU B (had fibroid surgery 2021, failed IUD x 3, allergic to Provera) history of iron deficiency anemia, DMT2 (already diet and exercise controlled as husband has DMT2), GERD, strong family history of HTN and DMT in her father brother and mother.  Son died of heart failure at age 31.  Last A1c was 6.7%.  She is not interested in taking medication at this time if her A1c is actually that low.  She has an excellent diet of primarily proteins and vegetables as her husband is a diabetic. She has reflux and takes no medications but she would like something to take because her stomach hurts so badly.  She took famotidine in the past as an H2 be and found that it was effective for her GERD.  Advised we could set that up for her. She has never had a colonoscopy and she is 53.  She is willing to get this done.  She is asymptomatic. She had fibroids removed in 2021 she had an IUD which she expelled x 3 she tried Depo-Provera and developed hives.  She was tapered off the Provera and did not have any bleeding.  She has had tremendous problems with anemia in the past but she has never had a transfusion or an iron infusion.  Outpatient Encounter Medications as of 11/07/2023  Medication Sig   ferrous sulfate 325 (65 FE) MG tablet Take by mouth.    metFORMIN (GLUCOPHAGE) 500 MG tablet Take 1 tablet (500 mg total) by mouth 1 day or 1 dose for 1 dose.   Multiple Vitamin (MULTI-VITAMINS) TABS Take by mouth.    medroxyPROGESTERone (PROVERA) 10 MG tablet Take 2 tablets daily   No facility-administered encounter medications on file as of  11/07/2023.    Past Medical History:  Diagnosis Date   Anxiety    History of kidney stones    Pre-diabetes     Past Surgical History:  Procedure Laterality Date   HYSTEROSCOPY WITH D & C N/A 07/23/2020   Procedure: DILATATION AND CURETTAGE /HYSTEROSCOPY/ with myosure;  Surgeon: Vena Austria, MD;  Location: [facility] ORS;  Service: Gynecology;  Laterality: N/A;   LITHOTRIPSY  2011   TUBAL LIGATION  1999    Family History  Problem Relation Age of Onset   Diabetes Mother    Breast cancer Mother 103   Diabetes Father     Social History   Socioeconomic History   Marital status: Married    Spouse name: Not on file   Number of children: Not on file   Years of education: Not on file   Highest education level: Not on file  Occupational History   Not on file  Tobacco Use   Smoking status: Never    Passive exposure: Never   Smokeless tobacco: Never  Vaping Use   Vaping status: Never Used  Substance and Sexual Activity   Alcohol use: No   Drug use: No   Sexual activity: Yes    Birth control/protection: I.U.D.  Other Topics Concern   Not on file  Social History  Narrative   Not on file   Social Drivers of Health   Financial Resource Strain: Low Risk  (12/22/2022)   Received from [facility]   Overall Financial Resource Strain (CARDIA)    Difficulty of Paying Living Expenses: Not very hard  Food Insecurity: Food Insecurity Present (12/22/2022)   Received from [facility]   Hunger Vital Sign    Worried About Running Out of Food in the Last Year: Sometimes true    Ran Out of Food in the Last Year: Sometimes true  Transportation Needs: No Transportation Needs (12/22/2022)   Received from [facility] - Transportation    In the past 12 months, has lack of transportation kept you from medical appointments or from getting medications?: No    Lack of Transportation (Non-Medical): No  Physical Activity: Not on file   Stress: Not on file  Social Connections: Not on file  Intimate Partner Violence: Not on file    ROS      Objective   BP 128/85 (BP Location: Right Arm, Patient Position: Sitting, Cuff Size: Normal)   Pulse 92   Temp 98.3 F (36.8 C) (Oral)   Resp 18   Ht 5\' 7"  (1.702 m)   Wt 203 lb (92.1 kg)   LMP  (LMP Unknown)   SpO2 97%   BMI 31.79 kg/m    Physical Exam Vitals and nursing note reviewed.  Constitutional:      Appearance: Normal appearance.  HENT:     Head: Normocephalic and atraumatic.  Eyes:     Conjunctiva/sclera: Conjunctivae normal.  Cardiovascular:     Rate and Rhythm: Normal rate and regular rhythm.  Pulmonary:     Effort: Pulmonary effort is normal.     Breath sounds: Normal breath sounds.  Musculoskeletal:     Right lower leg: No edema.     Left lower leg: No edema.  Skin:    General: Skin is warm and dry.  Neurological:     Mental Status: She is alert and oriented to person, place, and time.  Psychiatric:        Mood and Affect: Mood normal.        Behavior: Behavior normal.        Thought Content: Thought content normal.        Judgment: Judgment normal.            The 10-year ASCVD risk score (Arnett DK, et al., 2019) is: 4.7%     Assessment & Plan:  Screening for colorectal cancer -     Amb Referral to Colonoscopy  Elevated glucose level -     POCT glycosylated hemoglobin (Hb A1C)  Controlled type 2 diabetes mellitus with complication, without long-term current use of insulin (HCC) -     Lipid panel -     CBC with Differential/Platelet -     Comprehensive metabolic panel -     Microalbumin / creatinine urine ratio  Other orders -     metFORMIN HCl; Take 1 tablet (500 mg total) by mouth 1 day or 1 dose for 1 dose.  Dispense: 90 tablet; Refill: 1    Return in about 3 months (around 02/07/2024) for GERD.   Alease Medina, MD

## 2023-11-07 NOTE — Addendum Note (Signed)
 Addended by: Alease Medina on: 11/07/2023 02:10 PM   Modules accepted: Orders

## 2023-11-07 NOTE — Assessment & Plan Note (Signed)
 Referral for screening colonoscopy

## 2023-11-07 NOTE — Assessment & Plan Note (Signed)
 Had a uterine fibroid and experienced significant bleeding.  Is off Provera and is currently not bleeding.

## 2023-11-08 ENCOUNTER — Encounter: Payer: Self-pay | Admitting: Family Medicine

## 2023-11-08 LAB — LIPID PANEL
Chol/HDL Ratio: 6.3 ratio — ABNORMAL HIGH (ref 0.0–4.4)
Cholesterol, Total: 201 mg/dL — ABNORMAL HIGH (ref 100–199)
HDL: 32 mg/dL — ABNORMAL LOW (ref >39)
LDL Chol Calc (NIH): 83 mg/dL (ref 0–99)
Triglycerides: 531 mg/dL — ABNORMAL HIGH (ref 0–149)
VLDL Cholesterol Cal: 86 mg/dL — ABNORMAL HIGH (ref 5–40)

## 2023-11-08 LAB — MICROALBUMIN / CREATININE URINE RATIO
Creatinine, Urine: 90.5 mg/dL
Microalb/Creat Ratio: 70 mg/g{creat} — ABNORMAL HIGH (ref 0–29)
Microalbumin, Urine: 63.6 ug/mL

## 2023-11-08 MED ORDER — FAMOTIDINE 20 MG PO TABS: 20.0000 mg | ORAL_TABLET | Freq: Two times a day (BID) | ORAL | 3 refills | Status: DC

## 2023-11-08 NOTE — Addendum Note (Signed)
 Addended by: Alease Medina on: 11/08/2023 04:25 PM   Modules accepted: Orders

## 2023-11-09 ENCOUNTER — Telehealth: Payer: Self-pay

## 2023-11-09 ENCOUNTER — Other Ambulatory Visit: Payer: Self-pay

## 2023-11-09 DIAGNOSIS — Z1211 Encounter for screening for malignant neoplasm of colon: Secondary | ICD-10-CM

## 2023-11-09 DIAGNOSIS — Z1231 Encounter for screening mammogram for malignant neoplasm of breast: Secondary | ICD-10-CM | POA: Diagnosis not present

## 2023-11-09 MED ORDER — NA SULFATE-K SULFATE-MG SULF 17.5-3.13-1.6 GM/177ML PO SOLN: 1.0000 | Freq: Once | ORAL | 0 refills | Status: AC

## 2023-11-09 NOTE — Telephone Encounter (Signed)
 Gastroenterology Pre-Procedure Review  Request Date: 12/18/23 Requesting Physician: Dr. Allegra Lai  PATIENT REVIEW QUESTIONS: The patient responded to the following health history questions as indicated:    1. Are you having any GI issues? yes (GERD currently taking Pepcid) 2. Do you have a personal history of Polyps? no 3. Do you have a family history of Colon Cancer or Polyps? yes (Father diagnosed with colon polyp or colon cancer at 63 he passed away shortly afterwards) 4. Diabetes Mellitus? yes (takes metformin has been advised to stop 2 days prior to colonoscopy.  Report any blood sugar concerns or fluctuations to PCP to be advised.) 5. Joint replacements in the past 12 months?no 6. Major health problems in the past 3 months?no 7. Any artificial heart valves, MVP, or defibrillator?no    MEDICATIONS & ALLERGIES:    Patient reports the following regarding taking any anticoagulation/antiplatelet therapy:   Plavix, Coumadin, Eliquis, Xarelto, Lovenox, Pradaxa, Brilinta, or Effient? no Aspirin? no  Patient confirms/reports the following medications:  Current Outpatient Medications  Medication Sig Dispense Refill   famotidine (PEPCID) 20 MG tablet Take 1 tablet (20 mg total) by mouth 2 (two) times daily. 180 tablet 1   ferrous sulfate 325 (65 FE) MG tablet Take by mouth.      metFORMIN (GLUCOPHAGE) 500 MG tablet Take 1 tablet (500 mg total) by mouth 1 day or 1 dose for 1 dose. 90 tablet 1   Multiple Vitamin (MULTI-VITAMINS) TABS Take by mouth.      No current facility-administered medications for this visit.    Patient confirms/reports the following allergies:  Allergies  Allergen Reactions   Tea Tree Oil Rash    No orders of the defined types were placed in this encounter.   AUTHORIZATION INFORMATION Primary Insurance: 1D#: Group #:  Secondary Insurance: 1D#: Group #:  SCHEDULE INFORMATION: Date: 12/18/23 Time: Location: [facility]

## 2023-11-29 ENCOUNTER — Encounter: Payer: Self-pay | Admitting: Gastroenterology

## 2023-12-04 ENCOUNTER — Encounter: Payer: Self-pay | Admitting: Gastroenterology

## 2023-12-18 ENCOUNTER — Ambulatory Visit: Payer: Self-pay | Admitting: Anesthesiology

## 2023-12-18 ENCOUNTER — Other Ambulatory Visit: Payer: Self-pay

## 2023-12-18 ENCOUNTER — Encounter
Admission: RE | Disposition: A | Discharge status: Home / Self Care | Payer: Self-pay | Source: Home / Self Care | Attending: Gastroenterology

## 2023-12-18 ENCOUNTER — Encounter: Payer: Self-pay | Admitting: Gastroenterology

## 2023-12-18 ENCOUNTER — Ambulatory Visit
Admission: RE | Admit: 2023-12-18 | Discharge: 2023-12-18 | Disposition: A | Discharge status: Home / Self Care | Payer: Self-pay | Attending: Gastroenterology | Admitting: Gastroenterology

## 2023-12-18 DIAGNOSIS — K5730 Diverticulosis of large intestine without perforation or abscess without bleeding: Secondary | ICD-10-CM | POA: Insufficient documentation

## 2023-12-18 DIAGNOSIS — D124 Benign neoplasm of descending colon: Secondary | ICD-10-CM | POA: Insufficient documentation

## 2023-12-18 DIAGNOSIS — Z7984 Long term (current) use of oral hypoglycemic drugs: Secondary | ICD-10-CM | POA: Diagnosis not present

## 2023-12-18 DIAGNOSIS — E119 Type 2 diabetes mellitus without complications: Secondary | ICD-10-CM | POA: Insufficient documentation

## 2023-12-18 DIAGNOSIS — K621 Rectal polyp: Secondary | ICD-10-CM | POA: Diagnosis not present

## 2023-12-18 DIAGNOSIS — K219 Gastro-esophageal reflux disease without esophagitis: Secondary | ICD-10-CM | POA: Insufficient documentation

## 2023-12-18 DIAGNOSIS — D122 Benign neoplasm of ascending colon: Secondary | ICD-10-CM | POA: Insufficient documentation

## 2023-12-18 DIAGNOSIS — Z1211 Encounter for screening for malignant neoplasm of colon: Secondary | ICD-10-CM | POA: Insufficient documentation

## 2023-12-18 DIAGNOSIS — K635 Polyp of colon: Secondary | ICD-10-CM

## 2023-12-18 HISTORY — DX: Other urticaria: L50.8

## 2023-12-18 HISTORY — DX: Type 2 diabetes mellitus without complications: E11.9

## 2023-12-18 HISTORY — PX: COLONOSCOPY: SHX5424

## 2023-12-18 HISTORY — DX: Gastro-esophageal reflux disease without esophagitis: K21.9

## 2023-12-18 HISTORY — DX: Type 2 diabetes mellitus with hyperglycemia: E11.65

## 2023-12-18 HISTORY — PX: POLYPECTOMY: SHX149

## 2023-12-18 LAB — POCT PREGNANCY, URINE: Preg Test, Ur: NEGATIVE

## 2023-12-18 SURGERY — COLONOSCOPY
Anesthesia: General | Site: Rectum

## 2023-12-18 MED ORDER — LIDOCAINE HCL (PF) 2 % IJ SOLN
INTRAMUSCULAR | Status: AC
  Filled 2023-12-18: qty 5

## 2023-12-18 MED ORDER — PROPOFOL 500 MG/50ML IV EMUL
INTRAVENOUS | Status: DC | PRN
  Administered 2023-12-18: 125 ug/kg/min via INTRAVENOUS

## 2023-12-18 MED ORDER — LACTATED RINGERS IV SOLN: INTRAVENOUS | Status: DC

## 2023-12-18 MED ORDER — GLYCOPYRROLATE 0.2 MG/ML IJ SOLN
INTRAMUSCULAR | Status: AC
  Filled 2023-12-18: qty 1

## 2023-12-18 MED ORDER — PROPOFOL 10 MG/ML IV BOLUS
INTRAVENOUS | Status: DC | PRN
Start: 2023-12-18 — End: 2023-12-18
  Administered 2023-12-18: 60 mg via INTRAVENOUS
  Administered 2023-12-18: 30 mg via INTRAVENOUS

## 2023-12-18 MED ORDER — LIDOCAINE HCL (CARDIAC) PF 100 MG/5ML IV SOSY
PREFILLED_SYRINGE | INTRAVENOUS | Status: DC | PRN
  Administered 2023-12-18: 60 mg via INTRAVENOUS

## 2023-12-18 MED ORDER — SODIUM CHLORIDE 0.9 % IV SOLN: INTRAVENOUS | Status: DC | PRN

## 2023-12-18 MED ORDER — PROPOFOL 1000 MG/100ML IV EMUL
INTRAVENOUS | Status: AC
  Filled 2023-12-18: qty 100

## 2023-12-18 MED ORDER — PROPOFOL 10 MG/ML IV BOLUS
INTRAVENOUS | Status: AC
  Filled 2023-12-18: qty 20

## 2023-12-18 MED ORDER — STERILE WATER FOR IRRIGATION IR SOLN: Status: DC | PRN

## 2023-12-18 SURGICAL SUPPLY — 16 items
CLIP HMST 235XBRD CATH ROT (MISCELLANEOUS) IMPLANT
ELECT REM PT RETURN 9FT ADLT (ELECTROSURGICAL) IMPLANT
ELECTRODE REM PT RTRN 9FT ADLT (ELECTROSURGICAL) IMPLANT
FORCEPS BIOP RAD 4 LRG CAP 4 (CUTTING FORCEPS) IMPLANT
GOWN CVR UNV OPN BCK APRN NK (MISCELLANEOUS) ×4 IMPLANT
INJECTOR VARIJECT VIN23 (MISCELLANEOUS) IMPLANT
KIT DEFENDO VALVE AND CONN (KITS) IMPLANT
KIT PRC NS LF DISP ENDO (KITS) ×2 IMPLANT
MANIFOLD NEPTUNE II (INSTRUMENTS) ×2 IMPLANT
MARKER SPOT ENDO TATTOO 5ML (MISCELLANEOUS) IMPLANT
PROBE APC STR FIRE (PROBE) IMPLANT
RETRIEVER NET ROTH 2.5X230 LF (MISCELLANEOUS) IMPLANT
SNARE COLD EXACTO (MISCELLANEOUS) IMPLANT
TRAP ETRAP POLY (MISCELLANEOUS) IMPLANT
VARIJECT INJECTOR VIN23 (MISCELLANEOUS) IMPLANT
WATER STERILE IRR 250ML POUR (IV SOLUTION) ×2 IMPLANT

## 2023-12-18 NOTE — Anesthesia Preprocedure Evaluation (Addendum)
 Anesthesia Evaluation  Patient identified by MRN, date of birth, ID band Patient awake    Reviewed: Allergy & Precautions, H&P , NPO status , Patient's Chart, lab work & pertinent test results  Airway Mallampati: III  TM Distance: >3 FB Neck ROM: Full    Dental no notable dental hx.    Pulmonary neg pulmonary ROS   Pulmonary exam normal breath sounds clear to auscultation       Cardiovascular negative cardio ROS Normal cardiovascular exam Rhythm:Regular Rate:Normal     Neuro/Psych   Anxiety     negative neurological ROS  negative psych ROS   GI/Hepatic negative GI ROS, Neg liver ROS,GERD  ,,  Endo/Other  diabetes    Renal/GU negative Renal ROS  negative genitourinary   Musculoskeletal negative musculoskeletal ROS (+)    Abdominal   Peds negative pediatric ROS (+)  Hematology negative hematology ROS (+)   Anesthesia Other Findings Diabetes mellitus Chronic urticaria Anxiety GERD   Reproductive/Obstetrics negative OB ROS                             Anesthesia Physical Anesthesia Plan  ASA: 2  Anesthesia Plan: General   Post-op Pain Management:    Induction: Intravenous  PONV Risk Score and Plan:   Airway Management Planned: Natural Airway and Nasal Cannula  Additional Equipment:   Intra-op Plan:   Post-operative Plan:   Informed Consent: I have reviewed the patients History and Physical, chart, labs and discussed the procedure including the risks, benefits and alternatives for the proposed anesthesia with the patient or authorized representative who has indicated his/her understanding and acceptance.     Dental Advisory Given  Plan Discussed with: Anesthesiologist, CRNA and Surgeon  Anesthesia Plan Comments: (Patient consented for risks of anesthesia including but not limited to:  - adverse reactions to medications - risk of airway placement if required - damage  to eyes, teeth, lips or other oral mucosa - nerve damage due to positioning  - sore throat or hoarseness - Damage to heart, brain, nerves, lungs, other parts of body or loss of life  Patient voiced understanding and assent.)        Anesthesia Quick Evaluation

## 2023-12-18 NOTE — Op Note (Signed)
 [facility] Gastroenterology Patient Name: Heidi Allen Procedure Date: 12/18/2023 11:34 AM MRN: [id number] Account #: [id number] Date of Birth: July 22, 1971 Admit Type: Outpatient Age: 53 Room: [facility] OR ROOM 01 Gender: Female Note Status: Finalized Instrument Name: [id number] Procedure:             Colonoscopy Indications:           Screening for colorectal malignant neoplasm, This is                         the patient's first colonoscopy Providers:             Toney Reil MD, MD Referring MD:          Alease Medina (Referring MD) Medicines:             General Anesthesia Complications:         No immediate complications. Estimated blood loss: None. Procedure:             Pre-Anesthesia Assessment:                        - Prior to the procedure, a History and Physical was                         performed, and patient medications and allergies were                         reviewed. The patient is competent. The risks and                         benefits of the procedure and the sedation options and                         risks were discussed with the patient. All questions                         were answered and informed consent was obtained.                         Patient identification and proposed procedure were                         verified by the physician, the nurse, the                         anesthesiologist, the anesthetist and the technician                         in the pre-procedure area in the procedure room in the                         endoscopy suite. Mental Status Examination: alert and                         oriented. Airway Examination: normal oropharyngeal                         airway and neck mobility. Respiratory Examination:  clear to auscultation. CV Examination: normal.                         Prophylactic Antibiotics: The patient does not require                         prophylactic antibiotics.  Prior Anticoagulants: The                         patient has taken no anticoagulant or antiplatelet                         agents. ASA Grade Assessment: II - A patient with mild                         systemic disease. After reviewing the risks and                         benefits, the patient was deemed in satisfactory                         condition to undergo the procedure. The anesthesia                         plan was to use general anesthesia. Immediately prior                         to administration of medications, the patient was                         re-assessed for adequacy to receive sedatives. The                         heart rate, respiratory rate, oxygen saturations,                         blood pressure, adequacy of pulmonary ventilation, and                         response to care were monitored throughout the                         procedure. The physical status of the patient was                         re-assessed after the procedure.                        After obtaining informed consent, the colonoscope was                         passed under direct vision. Throughout the procedure,                         the patient's blood pressure, pulse, and oxygen                         saturations were monitored continuously. The  Colonoscope was introduced through the anus and                         advanced to the the cecum, identified by appendiceal                         orifice and ileocecal valve. The colonoscopy was                         performed without difficulty. The patient tolerated                         the procedure well. The quality of the bowel                         preparation was evaluated using the BBPS [facility] Bowel                         Preparation Scale) with scores of: Right Colon = 3,                         Transverse Colon = 3 and Left Colon = 3 (entire mucosa                         seen well with no  residual staining, small fragments                         of stool or opaque liquid). The total BBPS score                         equals 9. The ileocecal valve, appendiceal orifice,                         and rectum were photographed. Findings:      The perianal and digital rectal examinations were normal. Pertinent       negatives include normal sphincter tone and no palpable rectal lesions.      Four sessile polyps were found in the recto-sigmoid colon, descending       colon and ascending colon. The polyps were 3 to 6 mm in size. These       polyps were removed with a cold snare. Resection and retrieval were       complete. Estimated blood loss was minimal.      Multiple diverticula were found in the recto-sigmoid colon and sigmoid       colon.      The retroflexed view of the distal rectum and anal verge was normal and       showed no anal or rectal abnormalities. Impression:            - Four 3 to 6 mm polyps at the recto-sigmoid colon, in                         the descending colon and in the ascending colon,                         removed with a cold snare. Resected and retrieved.                        -  Diverticulosis in the recto-sigmoid colon and in the                         sigmoid colon.                        - The distal rectum and anal verge are normal on                         retroflexion view. Recommendation:        - Discharge patient to home (with escort).                        - Resume previous diet today.                        - Continue present medications.                        - Await pathology results.                        - Repeat colonoscopy in 3 - 5 years for surveillance                         based on pathology results. Procedure Code(s):     --- Professional ---                        [phone], Colonoscopy, flexible; with removal of                         tumor(s), polyp(s), or other lesion(s) by snare                          technique Diagnosis Code(s):     --- Professional ---                        Z12.11, Encounter for screening for malignant neoplasm                         of colon                        D12.7, Benign neoplasm of rectosigmoid junction                        D12.4, Benign neoplasm of descending colon                        D12.2, Benign neoplasm of ascending colon                        K57.30, Diverticulosis of large intestine without                         perforation or abscess without bleeding CPT copyright 2022 American Medical Association. All rights reserved. The codes documented in this report are preliminary and upon coder review may  be revised to meet current compliance requirements. Dr. Evia Hof Selena Daily MD, MD 12/18/2023 12:06:25 PM This report has  been signed electronically. Number of Addenda: 0 Note Initiated On: 12/18/2023 11:34 AM Scope Withdrawal Time: 0 hours 11 minutes 51 seconds  Total Procedure Duration: 0 hours 14 minutes 34 seconds  Estimated Blood Loss:  Estimated blood loss: none.      [facility]

## 2023-12-18 NOTE — Transfer of Care (Signed)
 Immediate Anesthesia Transfer of Care Note  Patient: Heidi Allen  Procedure(s) Performed: COLONOSCOPY (Rectum) POLYPECTOMY, INTESTINE (Rectum)  Patient Location: PACU  Anesthesia Type: General  Level of Consciousness: awake, alert  and patient cooperative  Airway and Oxygen Therapy: Patient Spontanous Breathing and Patient connected to supplemental oxygen  Post-op Assessment: Post-op Vital signs reviewed, Patient's Cardiovascular Status Stable, Respiratory Function Stable, Patent Airway and No signs of Nausea or vomiting  Post-op Vital Signs: Reviewed and stable  Complications: No notable events documented.

## 2023-12-18 NOTE — Anesthesia Postprocedure Evaluation (Signed)
 Anesthesia Post Note  Patient: Heidi Allen  Procedure(s) Performed: COLONOSCOPY (Rectum) POLYPECTOMY, INTESTINE (Rectum)  Patient location during evaluation: PACU Anesthesia Type: General Level of consciousness: awake and alert Pain management: pain level controlled Vital Signs Assessment: post-procedure vital signs reviewed and stable Respiratory status: spontaneous breathing, nonlabored ventilation, respiratory function stable and patient connected to nasal cannula oxygen Cardiovascular status: blood pressure returned to baseline and stable Postop Assessment: no apparent nausea or vomiting Anesthetic complications: no   No notable events documented.   Last Vitals:  Vitals:   12/18/23 1208 12/18/23 1216  BP: 123/83   Pulse: 89   Resp: 18   Temp: 36.7 C 37.1 C  SpO2: 95%     Last Pain:  Vitals:   12/18/23 1216  TempSrc:   PainSc: 0-No pain                 Baird Polinski C Jamie Belger

## 2023-12-18 NOTE — H&P (Signed)
 Arlyss Repress, MD [address]  [address]  [place], [place] [address]  Main: [phone]  Fax: [phone] Pager: [phone]  Primary Care Physician:  Alease Medina, MD Primary Gastroenterologist:  Dr. Arlyss Repress  Pre-Procedure History & Physical: HPI:  Heidi Allen is a 53 y.o. female is here for an colonoscopy.   Past Medical History:  Diagnosis Date   Anxiety    Chronic urticaria    Diabetes mellitus without complication (HCC)    GERD (gastroesophageal reflux disease)    Type 2 diabetes mellitus with hyperglycemia (HCC)     Past Surgical History:  Procedure Laterality Date   HYSTEROSCOPY WITH D & C N/A 07/23/2020   Procedure: DILATATION AND CURETTAGE /HYSTEROSCOPY/ with myosure;  Surgeon: Vena Austria, MD;  Location: [facility] ORS;  Service: Gynecology;  Laterality: N/A;   LITHOTRIPSY  2011   TUBAL LIGATION  1999    Prior to Admission medications   Medication Sig Start Date End Date Taking? Authorizing Provider  famotidine (PEPCID) 20 MG tablet Take 1 tablet (20 mg total) by mouth 2 (two) times daily. 11/07/23  Yes Ziglar, Eli Phillips, MD  ferrous sulfate 325 (65 FE) MG tablet Take by mouth.    Yes [provider]  metFORMIN (GLUCOPHAGE) 500 MG tablet Take 1 tablet (500 mg total) by mouth 1 day or 1 dose for 1 dose. 11/07/23 12/18/23 Yes Ziglar, Eli Phillips, MD  Multiple Vitamin (MULTI-VITAMINS) TABS Take by mouth.    Yes [provider]    Allergies as of 11/09/2023 - Review Complete 11/09/2023  Allergen Reaction Noted   Tea tree oil Rash 06/13/2022    Family History  Problem Relation Age of Onset   Diabetes Mother    Breast cancer Mother 55   Diabetes Father     Social History   Socioeconomic History   Marital status: Married    Spouse name: Not on file   Number of children: Not on file   Years of education: Not on file   Highest education level: Not on file  Occupational History   Not on file  Tobacco Use   Smoking status:  Never    Passive exposure: Never   Smokeless tobacco: Never  Vaping Use   Vaping status: Never Used  Substance and Sexual Activity   Alcohol use: No   Drug use: No   Sexual activity: Yes    Birth control/protection: I.U.D.  Other Topics Concern   Not on file  Social History Narrative   Not on file   Social Drivers of Health   Financial Resource Strain: Low Risk  (12/22/2022)   Received from [facility]   Overall Financial Resource Strain (CARDIA)    Difficulty of Paying Living Expenses: Not very hard  Food Insecurity: Food Insecurity Present (12/22/2022)   Received [facility]   Hunger Vital Sign    Worried About Running Out of Food in the Last Year: Sometimes true    Ran Out of Food in the Last Year: Sometimes true  Transportation Needs: No Transportation Needs (12/22/2022)   Received from [facility] - Transportation    In the past 12 months, has lack of transportation kept you from medical appointments or from getting medications?: No    Lack of Transportation (Non-Medical): No  Physical Activity: Not on file  Stress: Not on file  Social Connections: Not on file  Intimate Partner Violence: Not on file  Review of Systems: See HPI, otherwise negative ROS  Physical Exam: BP 134/88   Temp 98.2 F (36.8 C) (Temporal)   Resp 10   Ht 5' 7.01" (1.702 m)   Wt 90.7 kg   LMP  (LMP Unknown)   SpO2 96%   BMI 31.32 kg/m  General:   Alert,  pleasant and cooperative in NAD Head:  Normocephalic and atraumatic. Neck:  Supple; no masses or thyromegaly. Lungs:  Clear throughout to auscultation.    Heart:  Regular rate and rhythm. Abdomen:  Soft, nontender and nondistended. Normal bowel sounds, without guarding, and without rebound.   Neurologic:  Alert and  oriented x4;  grossly normal neurologically.  Impression/Plan: Heidi Allen is here for an colonoscopy to be performed for colon cancer  screening  Risks, benefits, limitations, and alternatives regarding  colonoscopy have been reviewed with the patient.  Questions have been answered.  All parties agreeable.   Ellis Guys, MD  12/18/2023, 10:52 AM

## 2023-12-19 LAB — SURGICAL PATHOLOGY

## 2023-12-20 ENCOUNTER — Encounter: Payer: Self-pay | Admitting: Gastroenterology

## 2023-12-27 DIAGNOSIS — E119 Type 2 diabetes mellitus without complications: Secondary | ICD-10-CM | POA: Diagnosis not present

## 2023-12-27 DIAGNOSIS — H53143 Visual discomfort, bilateral: Secondary | ICD-10-CM | POA: Diagnosis not present

## 2024-01-10 DIAGNOSIS — M1712 Unilateral primary osteoarthritis, left knee: Secondary | ICD-10-CM | POA: Diagnosis not present

## 2024-01-15 DIAGNOSIS — N939 Abnormal uterine and vaginal bleeding, unspecified: Secondary | ICD-10-CM | POA: Diagnosis not present

## 2024-01-18 ENCOUNTER — Encounter: Payer: Self-pay | Admitting: Licensed Practical Nurse

## 2024-01-18 ENCOUNTER — Ambulatory Visit (INDEPENDENT_AMBULATORY_CARE_PROVIDER_SITE_OTHER): Admitting: Licensed Practical Nurse

## 2024-01-18 VITALS — BP 132/89 | HR 96 | Ht 67.0 in | Wt 200.6 lb

## 2024-01-18 DIAGNOSIS — N939 Abnormal uterine and vaginal bleeding, unspecified: Secondary | ICD-10-CM

## 2024-01-18 MED ORDER — NORETHINDRONE ACETATE 5 MG PO TABS: 5.0000 mg | ORAL_TABLET | Freq: Two times a day (BID) | ORAL | 3 refills | Status: DC

## 2024-01-18 NOTE — Progress Notes (Addendum)
 HPI:      Heidi Allen is a 53 y.o. G3P3 who LMP not documented, perimenopausal, presents today for a problem visit.  She complains of heavy bleeding  that  began about 2 to 3 weeks ago and its severity is described as severe.  For the last 3 days she has needed to change her products every 1-1.5 hours, there have been clots. She was seen at the Urgent Care on 5/12, she CBC was normal except for an elevated white count. This has been an on going problem for Diane. She was previously followed by Dr Clemetine Cypher. In 2021 had a fibroid removed in hopes that that would resolved the AUB. It seemed to work for sometime. She was previously treated with Provera  but developed hives so stopped. She last had a bleed July 2024. Today she reports feeling tired and like her mind is not "as sharp"    PMHx: She  has a past medical history of Anxiety, Chronic urticaria, Diabetes mellitus without complication (HCC), GERD (gastroesophageal reflux disease), and Type 2 diabetes mellitus with hyperglycemia (HCC). Also,  has a past surgical history that includes Tubal ligation (1999); Lithotripsy (2011); Hysteroscopy with D & C (N/A, 07/23/2020); Colonoscopy (N/A, 12/18/2023); and Polypectomy (12/18/2023)., family history includes Breast cancer (age of onset: 52) in her mother; Diabetes in her father and mother.,  reports that she has never smoked. She has never been exposed to tobacco smoke. She has never used smokeless tobacco. She reports that she does not drink alcohol and does not use drugs.  She  Current Outpatient Medications:    famotidine  (PEPCID ) 20 MG tablet, Take 1 tablet (20 mg total) by mouth 2 (two) times daily., Disp: 180 tablet, Rfl: 1   ferrous sulfate 325 (65 FE) MG tablet, Take by mouth. , Disp: , Rfl:    Multiple Vitamin (MULTI-VITAMINS) TABS, Take by mouth. , Disp: , Rfl:    norethindrone  (AYGESTIN ) 5 MG tablet, Take 1 tablet (5 mg total) by mouth 2 (two) times daily., Disp: 60 tablet, Rfl: 3    metFORMIN  (GLUCOPHAGE ) 500 MG tablet, Take 1 tablet (500 mg total) by mouth 1 day or 1 dose for 1 dose., Disp: 90 tablet, Rfl: 1  Also, is allergic to tea tree oil.  ROS see HPI   Objective: BP 132/89 (BP Location: Right Arm, Patient Position: Sitting, Cuff Size: Normal)   Pulse 96   Ht 5\' 7"  (1.702 m)   Wt 200 lb 9.6 oz (91 kg)   LMP  (LMP Unknown)   BMI 31.42 kg/m  Physical Exam Constitutional:      Appearance: Normal appearance.  Genitourinary:     Vulva normal.     Genitourinary Comments: SSE: cervix pink, no lesions, moderate to heavy menses like discharge present  Bimanual exam: uterus about 10wks size, smooth, mobile, non-tender, adnexa non tender, no masses, not enlarged.   Cardiovascular:     Rate and Rhythm: Normal rate.  Pulmonary:     Effort: Pulmonary effort is normal.  Abdominal:     Tenderness: There is no abdominal tenderness.  Musculoskeletal:     Cervical back: Normal range of motion.  Neurological:     General: No focal deficit present.     Mental Status: She is alert.  Skin:    General: Skin is warm.  Psychiatric:        Mood and Affect: Mood normal.     ASSESSMENT/PLAN:  Abnormal uterine bleeding   Problem List Items Addressed This  Visit   None Visit Diagnoses       Abnormal uterine bleeding    -  Primary   Relevant Medications   norethindrone  (AYGESTIN ) 5 MG tablet   Other Relevant Orders   FSH/LH   Estradiol    CBC w/Diff/Platelet   US  PELVIS TRANSVAGINAL NON-OB (TV ONLY)       -reviewed with Dr Dell Fennel. Will treat with Aygestin , labs as above, Pelvic US  ordered.  -Pt to follow up with Dr Luster Salters to discuss surgical options  [place]  [facility]  01/18/24  9:50 AM

## 2024-01-19 LAB — FSH/LH
FSH: 14.9 m[IU]/mL
LH: 9 m[IU]/mL

## 2024-01-19 LAB — CBC WITH DIFFERENTIAL/PLATELET
Basophils Absolute: 0.1 10*3/uL (ref 0.0–0.2)
Basos: 1 %
EOS (ABSOLUTE): 0.2 10*3/uL (ref 0.0–0.4)
Eos: 1 %
Hematocrit: 35.2 % (ref 34.0–46.6)
Hemoglobin: 11.6 g/dL (ref 11.1–15.9)
Immature Grans (Abs): 0.1 10*3/uL (ref 0.0–0.1)
Immature Granulocytes: 1 %
Lymphocytes Absolute: 2.7 10*3/uL (ref 0.7–3.1)
Lymphs: 20 %
MCH: 31.1 pg (ref 26.6–33.0)
MCHC: 33 g/dL (ref 31.5–35.7)
MCV: 94 fL (ref 79–97)
Monocytes Absolute: 0.9 10*3/uL (ref 0.1–0.9)
Monocytes: 6 %
Neutrophils Absolute: 10.1 10*3/uL — ABNORMAL HIGH (ref 1.4–7.0)
Neutrophils: 71 %
Platelets: 353 10*3/uL (ref 150–450)
RBC: 3.73 x10E6/uL — ABNORMAL LOW (ref 3.77–5.28)
RDW: 13.2 % (ref 11.7–15.4)
WBC: 14.1 10*3/uL — ABNORMAL HIGH (ref 3.4–10.8)

## 2024-01-19 LAB — ESTRADIOL: Estradiol: 17.7 pg/mL

## 2024-01-24 ENCOUNTER — Encounter: Payer: Self-pay | Admitting: Licensed Practical Nurse

## 2024-01-25 ENCOUNTER — Ambulatory Visit
Admission: RE | Admit: 2024-01-25 | Discharge: 2024-01-25 | Disposition: A | Discharge status: Home / Self Care | Source: Ambulatory Visit | Attending: Licensed Practical Nurse | Admitting: Licensed Practical Nurse

## 2024-01-25 DIAGNOSIS — N939 Abnormal uterine and vaginal bleeding, unspecified: Secondary | ICD-10-CM

## 2024-01-25 DIAGNOSIS — D259 Leiomyoma of uterus, unspecified: Secondary | ICD-10-CM | POA: Diagnosis not present

## 2024-01-25 DIAGNOSIS — N83202 Unspecified ovarian cyst, left side: Secondary | ICD-10-CM | POA: Diagnosis not present

## 2024-01-25 DIAGNOSIS — R9389 Abnormal findings on diagnostic imaging of other specified body structures: Secondary | ICD-10-CM | POA: Diagnosis not present

## 2024-02-05 ENCOUNTER — Ambulatory Visit: Payer: Self-pay | Admitting: Licensed Practical Nurse

## 2024-02-13 ENCOUNTER — Encounter: Payer: Self-pay | Admitting: Obstetrics and Gynecology

## 2024-02-13 ENCOUNTER — Ambulatory Visit: Admitting: Obstetrics and Gynecology

## 2024-02-13 VITALS — BP 131/85 | HR 76 | Ht 67.0 in | Wt 202.8 lb

## 2024-02-13 DIAGNOSIS — D250 Submucous leiomyoma of uterus: Secondary | ICD-10-CM | POA: Diagnosis not present

## 2024-02-13 DIAGNOSIS — N83202 Unspecified ovarian cyst, left side: Secondary | ICD-10-CM

## 2024-02-13 DIAGNOSIS — N939 Abnormal uterine and vaginal bleeding, unspecified: Secondary | ICD-10-CM

## 2024-02-13 MED ORDER — LEVONORGEST-ETH ESTRAD 91-DAY 0.15-0.03 &0.01 MG PO TABS: 1.0000 | ORAL_TABLET | Freq: Every day | ORAL | 1 refills | Status: AC

## 2024-02-13 NOTE — Progress Notes (Signed)
 HPI:      Heidi Allen is a 53 y.o. G3P3 who LMP was Patient's last menstrual period was 01/01/2024.  Subjective:   She presents today to discuss her abnormal uterine bleeding.  Over the last year she went many months without having a menstrual period and then in April began bleeding.  Her bleeding was heavy and prolonged and she went on Aygestin .  This made the bleeding much lighter but she began to have side effects from the Aygestin .  She stopped the Aygestin  yesterday and has begun bleeding heavier again. She has had an ultrasound which has revealed multiple uterine fibroids (she has a history of previous myomectomy).  Some of these fibroids appear to be submucosal by ultrasound.  She has failed previous attempts at IUD/cycle control.  She was taking Provera  at one point and began to have hives from it.    Hx: The following portions of the patient's history were reviewed and updated as appropriate:             She  has a past medical history of Anxiety, Chronic urticaria, Diabetes mellitus without complication (HCC), GERD (gastroesophageal reflux disease), and Type 2 diabetes mellitus with hyperglycemia (HCC). She does not have any pertinent problems on file. She  has a past surgical history that includes Tubal ligation (1999); Lithotripsy (2011); Hysteroscopy with D & C (N/A, 07/23/2020); Colonoscopy (N/A, 12/18/2023); and Polypectomy (12/18/2023). Her family history includes Breast cancer (age of onset: 69) in her mother; Diabetes in her father and mother. She  reports that she has never smoked. She has never been exposed to tobacco smoke. She has never used smokeless tobacco. She reports that she does not drink alcohol and does not use drugs. She has a current medication list which includes the following prescription(s): famotidine , ferrous sulfate, levonorgestrel -ethinyl estradiol , multi-vitamins, and metformin . She is allergic to tea tree oil.       Review of Systems:  Review of  Systems  Constitutional: Denied constitutional symptoms, night sweats, recent illness, fatigue, fever, insomnia and weight loss.  Eyes: Denied eye symptoms, eye pain, photophobia, vision change and visual disturbance.  Ears/Nose/Throat/Neck: Denied ear, nose, throat or neck symptoms, hearing loss, nasal discharge, sinus congestion and sore throat.  Cardiovascular: Denied cardiovascular symptoms, arrhythmia, chest pain/pressure, edema, exercise intolerance, orthopnea and palpitations.  Respiratory: Denied pulmonary symptoms, asthma, pleuritic pain, productive sputum, cough, dyspnea and wheezing.  Gastrointestinal: Denied, gastro-esophageal reflux, melena, nausea and vomiting.  Genitourinary: See HPI for additional information.  Musculoskeletal: Denied musculoskeletal symptoms, stiffness, swelling, muscle weakness and myalgia.  Dermatologic: Denied dermatology symptoms, rash and scar.  Neurologic: Denied neurology symptoms, dizziness, headache, neck pain and syncope.  Psychiatric: Denied psychiatric symptoms, anxiety and depression.  Endocrine: Denied endocrine symptoms including hot flashes and night sweats.   Meds:   Current Outpatient Medications on File Prior to Visit  Medication Sig Dispense Refill   famotidine  (PEPCID ) 20 MG tablet Take 1 tablet (20 mg total) by mouth 2 (two) times daily. 180 tablet 1   ferrous sulfate 325 (65 FE) MG tablet Take by mouth.      Multiple Vitamin (MULTI-VITAMINS) TABS Take by mouth.      metFORMIN  (GLUCOPHAGE ) 500 MG tablet Take 1 tablet (500 mg total) by mouth 1 day or 1 dose for 1 dose. 90 tablet 1   No current facility-administered medications on file prior to visit.      Objective:      Vitals:   02/13/24 0838  BP: 131/85  Pulse: 76   Filed Weights   02/13/24 0838  Weight: 202 lb 12.8 oz (92 kg)              Ultrasound results reviewed          Assessment:     G3P3 Patient Active Problem List   Diagnosis Date Noted   Polyp of  descending colon 12/18/2023   Polyp of ascending colon 12/18/2023   Healthcare maintenance 11/07/2023   Diabetes mellitus type 2 with complications (HCC) 11/07/2023   GERD (gastroesophageal reflux disease) 11/07/2023   Fibroids, submucosal    Abnormal uterine bleeding (AUB)      1. Abnormal uterine bleeding   2. Fibroids, submucosal   3. Left ovarian cyst     Dysfunctional bleeding likely secondary to uterine fibroids.  Patient not yet menopausal based on [facility].  Small ovarian cyst present-asymptomatic.   Plan:            1.  We have discussed multiple options for management of her dysfunctional bleeding.  These include hormonal control methods, UFE and hysterectomy.  Risk benefits of each discussed.  She has chosen to try to cycle on OCPs at this time.  We have chosen Seasonique.  Patient instructed in its use.  Ovarian cyst likely benign.  Recommend follow-up ultrasound in 6 weeks. Orders Orders Placed This Encounter  Procedures   US  PELVIS TRANSVAGINAL NON-OB (TV ONLY)     Meds ordered this encounter  Medications   Levonorgestrel -Ethinyl Estradiol  (AMETHIA) 0.15-0.03 &0.01 MG tablet    Sig: Take 1 tablet by mouth at bedtime.    Dispense:  84 tablet    Refill:  1      F/U  Return in about 6 months (around 08/14/2024) for Pt to contact us  if symptoms worsen.  Delice Felt, M.D. 02/13/2024 9:44 AM

## 2024-02-13 NOTE — Progress Notes (Signed)
 Patient presents today to discuss abnormal bleeding along with recent ultrasound results. She states taking Aygestin  for the last 3 weeks but stopped yesterday due to side effects of the medication, reports bleeding has become heavier. Patient has a history of using Provera  but stopped due to getting hives. She is unsure at this time if she would like another medication or a hysterectomy. No additional concerns.

## 2024-03-17 DIAGNOSIS — N390 Urinary tract infection, site not specified: Secondary | ICD-10-CM | POA: Diagnosis not present

## 2024-03-26 ENCOUNTER — Ambulatory Visit (INDEPENDENT_AMBULATORY_CARE_PROVIDER_SITE_OTHER)

## 2024-03-26 DIAGNOSIS — N83202 Unspecified ovarian cyst, left side: Secondary | ICD-10-CM

## 2024-04-10 ENCOUNTER — Encounter: Payer: Self-pay | Admitting: Obstetrics and Gynecology

## 2024-04-16 ENCOUNTER — Other Ambulatory Visit: Payer: Self-pay | Admitting: Medical Genetics

## 2024-04-19 ENCOUNTER — Other Ambulatory Visit

## 2024-04-29 ENCOUNTER — Ambulatory Visit (INDEPENDENT_AMBULATORY_CARE_PROVIDER_SITE_OTHER): Admitting: Family Medicine

## 2024-04-29 ENCOUNTER — Encounter: Payer: Self-pay | Admitting: Family Medicine

## 2024-04-29 VITALS — BP 141/91 | HR 101 | Temp 98.2°F | Resp 20 | Ht 67.0 in | Wt 203.0 lb

## 2024-04-29 DIAGNOSIS — N939 Abnormal uterine and vaginal bleeding, unspecified: Secondary | ICD-10-CM | POA: Diagnosis not present

## 2024-04-29 DIAGNOSIS — E118 Type 2 diabetes mellitus with unspecified complications: Secondary | ICD-10-CM | POA: Diagnosis not present

## 2024-04-29 LAB — POCT GLYCOSYLATED HEMOGLOBIN (HGB A1C): Hemoglobin A1C: 7.6 % — AB (ref 4.0–5.6)

## 2024-04-29 MED ORDER — METFORMIN HCL 500 MG PO TABS: 500.0000 mg | ORAL_TABLET | Freq: Two times a day (BID) | ORAL | 3 refills | Status: AC

## 2024-04-29 NOTE — Assessment & Plan Note (Signed)
 She is working with a gynecologist about her dysfunctional uterine bleeding.  She is worried she is getting anemic.  Will check CBC, ferritin,  iron saturation.  She is taking iron sulfate 325 mg daily without difficulty.
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
## 2024-04-29 NOTE — Assessment & Plan Note (Signed)
 A1c today is 7.6.  Not ideal control.  Increase metformin  to 500 mg twice daily with food follow-up in 3 months to check an A1c.  Please continue with your excellent diet and exercise.

## 2024-04-30 ENCOUNTER — Ambulatory Visit: Payer: Self-pay | Admitting: Family Medicine

## 2024-04-30 LAB — CBC WITH DIFFERENTIAL/PLATELET
Basophils Absolute: 0.1 x10E3/uL (ref 0.0–0.2)
Basos: 1 %
EOS (ABSOLUTE): 0.2 x10E3/uL (ref 0.0–0.4)
Eos: 2 %
Hematocrit: 39.7 % (ref 34.0–46.6)
Hemoglobin: 12.7 g/dL (ref 11.1–15.9)
Immature Grans (Abs): 0 x10E3/uL (ref 0.0–0.1)
Immature Granulocytes: 0 %
Lymphocytes Absolute: 2.5 x10E3/uL (ref 0.7–3.1)
Lymphs: 21 %
MCH: 28.7 pg (ref 26.6–33.0)
MCHC: 32 g/dL (ref 31.5–35.7)
MCV: 90 fL (ref 79–97)
Monocytes Absolute: 0.8 x10E3/uL (ref 0.1–0.9)
Monocytes: 7 %
Neutrophils Absolute: 8.2 x10E3/uL — ABNORMAL HIGH (ref 1.4–7.0)
Neutrophils: 69 %
Platelets: 388 x10E3/uL (ref 150–450)
RBC: 4.42 x10E6/uL (ref 3.77–5.28)
RDW: 13.4 % (ref 11.7–15.4)
WBC: 11.8 x10E3/uL — ABNORMAL HIGH (ref 3.4–10.8)

## 2024-04-30 LAB — IRON,TIBC AND FERRITIN PANEL
Ferritin: 26 ng/mL (ref 15–150)
Iron Saturation: 17 % (ref 15–55)
Iron: 58 ug/dL (ref 27–159)
Total Iron Binding Capacity: 336 ug/dL (ref 250–450)
UIBC: 278 ug/dL (ref 131–425)

## 2024-05-13 DIAGNOSIS — J0110 Acute frontal sinusitis, unspecified: Secondary | ICD-10-CM | POA: Diagnosis not present

## 2024-07-01 ENCOUNTER — Other Ambulatory Visit: Payer: Self-pay | Admitting: Medical Genetics

## 2024-07-01 DIAGNOSIS — Z006 Encounter for examination for normal comparison and control in clinical research program: Secondary | ICD-10-CM

## 2024-07-13 DIAGNOSIS — M1712 Unilateral primary osteoarthritis, left knee: Secondary | ICD-10-CM | POA: Diagnosis not present
